# Patient Record
Sex: Male | Born: 1965
Health system: Southern US, Community
[De-identification: ages and names within clinical notes are randomized; demographics above are authoritative.]

## PROBLEM LIST (undated history)

## (undated) DIAGNOSIS — K219 Gastro-esophageal reflux disease without esophagitis: Secondary | ICD-10-CM

## (undated) DIAGNOSIS — I10 Essential (primary) hypertension: Secondary | ICD-10-CM

## (undated) DIAGNOSIS — G4730 Sleep apnea, unspecified: Secondary | ICD-10-CM

## (undated) HISTORY — PX: BOWEL RESECTION: SHX1257

## (undated) HISTORY — PX: RHINOPLASTY: SUR1284

---

## 1998-02-01 ENCOUNTER — Encounter: Payer: Self-pay | Admitting: Surgery

## 1998-02-01 ENCOUNTER — Ambulatory Visit (HOSPITAL_COMMUNITY): Admission: RE | Admit: 1998-02-01 | Discharge: 1998-02-01 | Payer: Self-pay | Admitting: Surgery

## 1998-04-24 ENCOUNTER — Ambulatory Visit (HOSPITAL_COMMUNITY): Admission: RE | Admit: 1998-04-24 | Discharge: 1998-04-24 | Payer: Self-pay | Admitting: Surgery

## 1998-06-13 ENCOUNTER — Inpatient Hospital Stay (HOSPITAL_COMMUNITY): Admission: RE | Admit: 1998-06-13 | Discharge: 1998-06-19 | Payer: Self-pay | Admitting: Surgery

## 1998-06-13 ENCOUNTER — Encounter: Payer: Self-pay | Admitting: Surgery

## 1999-05-29 ENCOUNTER — Ambulatory Visit (HOSPITAL_COMMUNITY): Admission: RE | Admit: 1999-05-29 | Discharge: 1999-05-29 | Payer: Self-pay | Admitting: Occupational Medicine

## 1999-05-29 ENCOUNTER — Encounter: Payer: Self-pay | Admitting: Occupational Medicine

## 1999-09-20 ENCOUNTER — Encounter: Payer: Self-pay | Admitting: Neurosurgery

## 1999-09-20 ENCOUNTER — Ambulatory Visit (HOSPITAL_COMMUNITY): Admission: RE | Admit: 1999-09-20 | Discharge: 1999-09-20 | Payer: Self-pay | Admitting: Neurosurgery

## 1999-10-05 ENCOUNTER — Ambulatory Visit (HOSPITAL_COMMUNITY): Admission: RE | Admit: 1999-10-05 | Discharge: 1999-10-05 | Payer: Self-pay | Admitting: Neurosurgery

## 1999-10-05 ENCOUNTER — Encounter: Payer: Self-pay | Admitting: Neurosurgery

## 1999-10-31 ENCOUNTER — Ambulatory Visit (HOSPITAL_COMMUNITY): Admission: RE | Admit: 1999-10-31 | Discharge: 1999-10-31 | Payer: Self-pay | Admitting: Neurosurgery

## 1999-12-06 ENCOUNTER — Encounter: Payer: Self-pay | Admitting: Neurosurgery

## 1999-12-06 ENCOUNTER — Ambulatory Visit (HOSPITAL_COMMUNITY): Admission: RE | Admit: 1999-12-06 | Discharge: 1999-12-06 | Payer: Self-pay | Admitting: Neurosurgery

## 2003-03-17 ENCOUNTER — Ambulatory Visit: Admission: RE | Admit: 2003-03-17 | Discharge: 2003-03-17 | Payer: Self-pay | Admitting: Emergency Medicine

## 2003-03-18 ENCOUNTER — Ambulatory Visit (HOSPITAL_COMMUNITY): Admission: RE | Admit: 2003-03-18 | Discharge: 2003-03-18 | Payer: Self-pay | Admitting: Neurology

## 2005-06-05 ENCOUNTER — Ambulatory Visit (HOSPITAL_BASED_OUTPATIENT_CLINIC_OR_DEPARTMENT_OTHER): Admission: RE | Admit: 2005-06-05 | Discharge: 2005-06-05 | Payer: Self-pay | Admitting: Otolaryngology

## 2005-06-09 ENCOUNTER — Ambulatory Visit: Payer: Self-pay | Admitting: Internal Medicine

## 2006-05-20 ENCOUNTER — Ambulatory Visit (HOSPITAL_COMMUNITY): Admission: RE | Admit: 2006-05-20 | Discharge: 2006-05-20 | Payer: Self-pay | Admitting: Orthopedic Surgery

## 2007-07-09 ENCOUNTER — Emergency Department (HOSPITAL_COMMUNITY): Admission: EM | Admit: 2007-07-09 | Discharge: 2007-07-09 | Payer: Self-pay | Admitting: Emergency Medicine

## 2008-04-29 ENCOUNTER — Emergency Department (HOSPITAL_COMMUNITY): Admission: EM | Admit: 2008-04-29 | Discharge: 2008-04-29 | Payer: Self-pay | Admitting: Family Medicine

## 2008-11-23 ENCOUNTER — Ambulatory Visit (HOSPITAL_COMMUNITY): Admission: RE | Admit: 2008-11-23 | Discharge: 2008-11-23 | Payer: Self-pay | Admitting: Emergency Medicine

## 2008-11-30 ENCOUNTER — Ambulatory Visit (HOSPITAL_COMMUNITY): Admission: RE | Admit: 2008-11-30 | Discharge: 2008-11-30 | Payer: Self-pay | Admitting: Internal Medicine

## 2009-05-29 ENCOUNTER — Ambulatory Visit (HOSPITAL_COMMUNITY): Admission: RE | Admit: 2009-05-29 | Discharge: 2009-05-29 | Payer: Self-pay | Admitting: Otolaryngology

## 2009-06-02 ENCOUNTER — Emergency Department (HOSPITAL_COMMUNITY)
Admission: EM | Admit: 2009-06-02 | Discharge: 2009-06-02 | Payer: Self-pay | Source: Home / Self Care | Admitting: Emergency Medicine

## 2009-06-08 ENCOUNTER — Encounter (INDEPENDENT_AMBULATORY_CARE_PROVIDER_SITE_OTHER): Payer: Self-pay | Admitting: *Deleted

## 2009-06-26 ENCOUNTER — Encounter: Payer: Self-pay | Admitting: Internal Medicine

## 2009-06-27 ENCOUNTER — Encounter: Payer: Self-pay | Admitting: Internal Medicine

## 2010-01-12 ENCOUNTER — Ambulatory Visit (HOSPITAL_COMMUNITY)
Admission: AD | Admit: 2010-01-12 | Discharge: 2010-01-12 | Payer: Self-pay | Source: Home / Self Care | Attending: Otolaryngology | Admitting: Otolaryngology

## 2010-01-12 ENCOUNTER — Ambulatory Visit (HOSPITAL_COMMUNITY)
Admission: RE | Admit: 2010-01-12 | Discharge: 2010-01-12 | Payer: Self-pay | Source: Home / Self Care | Attending: Otolaryngology | Admitting: Otolaryngology

## 2010-01-24 ENCOUNTER — Ambulatory Visit (HOSPITAL_COMMUNITY)
Admission: RE | Admit: 2010-01-24 | Discharge: 2010-01-25 | Payer: Self-pay | Source: Home / Self Care | Attending: Otolaryngology | Admitting: Otolaryngology

## 2010-04-09 LAB — BASIC METABOLIC PANEL
BUN: 5 mg/dL — ABNORMAL LOW (ref 6–23)
CO2: 27 mEq/L (ref 19–32)
Calcium: 8.9 mg/dL (ref 8.4–10.5)
Glucose, Bld: 167 mg/dL — ABNORMAL HIGH (ref 70–99)
Sodium: 139 mEq/L (ref 135–145)

## 2010-04-09 LAB — SURGICAL PCR SCREEN: Staphylococcus aureus: POSITIVE — AB

## 2010-04-09 LAB — CBC
Hemoglobin: 14.6 g/dL (ref 13.0–17.0)
MCH: 31.2 pg (ref 26.0–34.0)
MCHC: 34.1 g/dL (ref 30.0–36.0)

## 2010-04-10 NOTE — Letter (Signed)
Summary: [facility]   Imported By: Marylou Mccoy 04/06/2010 14:31:12  _____________________________________________________________________  External Attachment:    Type:   Image     Comment:   External Document

## 2010-04-17 LAB — CBC
HCT: 41.9 % (ref 39.0–52.0)
Hemoglobin: 14.6 g/dL (ref 13.0–17.0)
MCHC: 34.9 g/dL (ref 30.0–36.0)
RDW: 13.5 % (ref 11.5–15.5)

## 2010-04-17 NOTE — Letter (Signed)
Summary: [facility]   Imported By: Marylou Mccoy 04/06/2010 14:35:29  _____________________________________________________________________  External Attachment:    Type:   Image     Comment:   External Document

## 2010-06-15 NOTE — Procedures (Signed)
NAME:  Gary Ramos, Gary Ramos                ACCOUNT NO.:  [id number]   MEDICAL RECORD NO.:  [id number]          PATIENT TYPE:  OUT   LOCATION:  SLEEP CENTER                 FACILITY:  [facility]   PHYSICIAN:  Clinton D. Maple Hudson, M.D. DATE OF BIRTH:  [birth year]   DATE OF STUDY:  06/05/2005                              NOCTURNAL POLYSOMNOGRAM   INDICATION FOR STUDY:  Hypersomnia with sleep apnea.   Epworth sleepiness score 06/24, BMI 33.  Weight 225 pounds.  No home  medications listed.  This study was done as a daytime study to accommodate  his work schedule and usual sleep habit.   SLEEP ARCHITECTURE:  Total sleep time 313 minutes with sleep efficiency 88%.  Stage I was 6%, stage II 53%, stages III and IV 28%.  REM 13% of total sleep  time.  Sleep latency 7 minutes, REM latency 164 minutes, awake after sleep  onset 29 minutes, arousal index 22.6, which was mildly increased.  No  bedtime medication was reported.   RESPIRATORY DATA:  Split study protocol.  Apnea/hypopnea index (AHI, RDI)  105.7 obstructive events per hour indicating severe obstructive sleep  apnea/hypopnea syndrome before CPAP.  This included 121 obstructive apneas,  108 hypopneas.  Events were more common while supine but also significantly  present while sleeping on sides.  REM AHI 1.5 per hour.  CPAP was titrated  to 9 cwp, AHI 1.1 per hour.  The technician did not indicate which CPAP mask  style was used, but did report that the patient found it uncomfortable  despite adjustments.   OXYGEN DATA:  Very loud snoring with oxygen desaturation to a nadir of 81%.  Mean oxygen saturation after CPAP control was 95-98% on room air.   CARDIAC DATA:  Normal sinus rhythm.   MOVEMENT/PARASOMNIA:  Occasional leg jerk, insignificant.  Bathroom x2.   IMPRESSION/RECOMMENDATION:  1.  Daytime sleep study recording sleep time onset 8:45 a.m. to 2:43 p.m.,      to accommodate the patient's work schedule and sleep habit.  Expect some      loss  of sleep quality associated with third shift and shifting sleep      schedules.  2.  Severe obstructive sleep apnea/hypopnea syndrome, AHI 105.7 per hour.      Events were relatively more common while supine but not substantially      improved by sleeping on side.  Very loud snoring with oxygen      desaturation to a nadir of 81%.  3.  Successful CPAP titration to 9 cwp, AHI of 1.1 per hour.  The patient      was not comfortable with the mask styles tried and would need additional      fitting by [facility].  A heated humidifier was used.      Clinton D. Maple Hudson, M.D.  Diplomate, Biomedical engineer of Sleep Medicine  Electronically Signed     CDY/MEDQ  D:  06/09/2005 11:46:03  T:  06/10/2005 11:05:27  Job:  [id number]

## 2013-04-27 ENCOUNTER — Ambulatory Visit (HOSPITAL_COMMUNITY)
Admission: RE | Admit: 2013-04-27 | Discharge: 2013-04-27 | Disposition: A | Discharge status: Home / Self Care | Payer: 59 | Source: Ambulatory Visit | Attending: Internal Medicine | Admitting: Internal Medicine

## 2013-04-27 ENCOUNTER — Other Ambulatory Visit (HOSPITAL_COMMUNITY): Payer: Self-pay | Admitting: Internal Medicine

## 2013-04-27 DIAGNOSIS — R29898 Other symptoms and signs involving the musculoskeletal system: Secondary | ICD-10-CM

## 2013-04-27 DIAGNOSIS — R52 Pain, unspecified: Secondary | ICD-10-CM

## 2013-04-27 DIAGNOSIS — M79609 Pain in unspecified limb: Secondary | ICD-10-CM

## 2013-04-27 DIAGNOSIS — M4802 Spinal stenosis, cervical region: Secondary | ICD-10-CM | POA: Insufficient documentation

## 2013-04-27 DIAGNOSIS — M542 Cervicalgia: Secondary | ICD-10-CM | POA: Insufficient documentation

## 2013-04-27 DIAGNOSIS — M5020 Other cervical disc displacement, unspecified cervical region: Secondary | ICD-10-CM | POA: Insufficient documentation

## 2013-04-27 DIAGNOSIS — R209 Unspecified disturbances of skin sensation: Secondary | ICD-10-CM | POA: Insufficient documentation

## 2013-05-07 ENCOUNTER — Other Ambulatory Visit: Payer: Self-pay | Admitting: Neurosurgery

## 2013-05-31 ENCOUNTER — Encounter (HOSPITAL_COMMUNITY): Payer: Self-pay | Admitting: Pharmacy Technician

## 2013-06-01 ENCOUNTER — Encounter (HOSPITAL_COMMUNITY): Payer: Self-pay

## 2013-06-01 ENCOUNTER — Encounter (HOSPITAL_COMMUNITY)
Admission: RE | Admit: 2013-06-01 | Discharge: 2013-06-01 | Disposition: A | Discharge status: Home / Self Care | Payer: 59 | Source: Ambulatory Visit | Attending: Neurosurgery | Admitting: Neurosurgery

## 2013-06-01 ENCOUNTER — Encounter (HOSPITAL_COMMUNITY)
Admission: RE | Admit: 2013-06-01 | Discharge: 2013-06-01 | Disposition: A | Discharge status: Home / Self Care | Payer: 59 | Source: Ambulatory Visit | Attending: Anesthesiology | Admitting: Anesthesiology

## 2013-06-01 DIAGNOSIS — Z01812 Encounter for preprocedural laboratory examination: Secondary | ICD-10-CM | POA: Insufficient documentation

## 2013-06-01 DIAGNOSIS — Z01810 Encounter for preprocedural cardiovascular examination: Secondary | ICD-10-CM | POA: Insufficient documentation

## 2013-06-01 DIAGNOSIS — Z01818 Encounter for other preprocedural examination: Secondary | ICD-10-CM | POA: Insufficient documentation

## 2013-06-01 HISTORY — DX: Gastro-esophageal reflux disease without esophagitis: K21.9

## 2013-06-01 HISTORY — DX: Essential (primary) hypertension: I10

## 2013-06-01 HISTORY — DX: Sleep apnea, unspecified: G47.30

## 2013-06-01 LAB — BASIC METABOLIC PANEL
BUN: 14 mg/dL (ref 6–23)
CO2: 27 mEq/L (ref 19–32)
Calcium: 9.3 mg/dL (ref 8.4–10.5)
Chloride: 103 mEq/L (ref 96–112)
Creatinine, Ser: 0.83 mg/dL (ref 0.50–1.35)
Glucose, Bld: 93 mg/dL (ref 70–99)
POTASSIUM: 4.5 meq/L (ref 3.7–5.3)
Sodium: 141 mEq/L (ref 137–147)

## 2013-06-01 LAB — SURGICAL PCR SCREEN
MRSA, PCR: NEGATIVE
Staphylococcus aureus: POSITIVE — AB

## 2013-06-01 LAB — CBC
HCT: 44.5 % (ref 39.0–52.0)
HEMOGLOBIN: 15.5 g/dL (ref 13.0–17.0)
MCH: 32.6 pg (ref 26.0–34.0)
MCHC: 34.8 g/dL (ref 30.0–36.0)
MCV: 93.5 fL (ref 78.0–100.0)
Platelets: 250 10*3/uL (ref 150–400)
RBC: 4.76 MIL/uL (ref 4.22–5.81)
RDW: 13.3 % (ref 11.5–15.5)
WBC: 9.7 10*3/uL (ref 4.0–10.5)

## 2013-06-01 NOTE — Progress Notes (Signed)
Prescription called into [facility] out[facility] pharmacy per patient request.

## 2013-06-01 NOTE — Pre-Procedure Instructions (Signed)
Duncan DullJoseph A Halleck  06/01/2013   Your procedure is scheduled on:  Friday, May 15th  [facility] Admitting at 0530 AM.  Call this number if you have problems the morning of surgery: [phone]   Remember:   Do not eat food or drink liquids after midnight.   Take these medicines the morning of surgery with A SIP OF WATER: prevacid   Do not wear jewelry.  Do not wear lotions, powders, or perfumes. You may wear deodorant.  Do not shave 48 hours prior to surgery. Men may shave face and neck.  Do not bring valuables to the hospital.  [facility] is not responsible for any belongings or valuables.               Contacts, dentures or bridgework may not be worn into surgery.  Leave suitcase in the car. After surgery it may be brought to your room.  For patients admitted to the hospital, discharge time is determined by your  treatment team.               Patients discharged the day of surgery will not be allowed to drive home.  Please read over the following fact sheets that you were given: Pain Booklet, Coughing and Deep Breathing, MRSA Information and Surgical Site Infection Prevention [place] - Preparing for Surgery  Before surgery, you can play an important role.  Because skin is not sterile, your skin needs to be as free of germs as possible.  You can reduce the number of germs on you skin by washing with CHG (chlorahexidine gluconate) soap before surgery.  CHG is an antiseptic cleaner which kills germs and bonds with the skin to continue killing germs even after washing.  Please DO NOT use if you have an allergy to CHG or antibacterial soaps.  If your skin becomes reddened/irritated stop using the CHG and inform your nurse when you arrive at Short Stay.  Do not shave (including legs and underarms) for at least 48 hours prior to the first CHG shower.  You may shave your face.  Please follow these instructions carefully:   1.  Shower with CHG Soap the night before  surgery and the morning of Surgery.  2.  If you choose to wash your hair, wash your hair first as usual with your normal shampoo.  3.  After you shampoo, rinse your hair and body thoroughly to remove the shampoo.  4.  Use CHG as you would any other liquid soap.  You can apply CHG directly to the skin and wash gently with scrungie or a clean washcloth.  5.  Apply the CHG Soap to your body ONLY FROM THE NECK DOWN.  Do not use on open wounds or open sores.  Avoid contact with your eyes, ears, mouth and genitals (private parts).  Wash genitals (private parts) with your normal soap.  6.  Wash thoroughly, paying special attention to the area where your surgery will be performed.  7.  Thoroughly rinse your body with warm water from the neck down.  8.  DO NOT shower/wash with your normal soap after using and rinsing off the CHG Soap.  9.  Pat yourself dry with a clean towel.            10.  Wear clean pajamas.            11.  Place clean sheets on your bed the night of your first shower and do not  sleep with pets.  Day of Surgery  Do not apply any lotions/deoderants the morning of surgery.  Please wear clean clothes to the hospital/surgery center.

## 2013-06-01 NOTE — Progress Notes (Signed)
Primary physician - dr. Pearson GrippeJames kim No prior cardiac testing other than ekg several years ago.

## 2013-06-09 NOTE — H&P (Signed)
> [address] [place], [place] [address] Phone: [phone]   Patient ID:   [id number] Patient: Gary Ramos  Date of Birth: 11/20/1965 Visit Type: Office Visit   Date: 05/05/2013 01:00 PM Provider: Danae Orleans. Venetia Maxon MD   This 48 year old male presents for back pain.  History of Present Illness: 1.  back pain  Gary Ramos, 48y.o. 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  S/S began after cold s/s and coughing in Feb.  DepoMedrol IM helped s/s, but percocet did not offer relief.    Motrin 800mg  TID  SxHx: '98 CTR; '00 bowel resection; '13 septoplasty  MRI, X-ray on [facility]  Patient is currently complaining of left shoulder and upper back pain along with scapula.  He notes numbness in his left hand and arm.  He says he is weak.  He has had significantly increasing pain since February of this year when he developed the symptoms after a cold with coughing.  He notes that he got some relief with a Depo-Medrol IM injection of 80 mg.  However this is no longer helpful.        PAST MEDICAL/SURGICAL HISTORY   (Detailed)  Disease/disorder Onset Date Management Date Comments    Carpal tunnel release      Small bowel resection      Rhinoplasty/Septoplasty    Hypertension          PAST MEDICAL HISTORY, SURGICAL HISTORY, FAMILY HISTORY, SOCIAL HISTORY AND REVIEW OF SYSTEMS I have reviewed the patient's past medical, surgical, family and social history as well as the comprehensive review of systems as included on the [facility] history form dated 05/05/2013, which I have signed.  Family History  (Detailed)  Relationship Family Member Name Deceased Age at Death Condition Onset Age Cause of Death  Mother    Hypertension  N  Mother    Diabetes mellitus type 2  N   SOCIAL HISTORY  (Detailed) Tobacco use reviewed. Preferred language  is Unknown.   Smoking status: Never smoker.  SMOKING STATUS Use Status Type Smoking Status Usage Per Day Years Used Total Pack Years  no/never  Never smoker             MEDICATIONS(added, continued or stopped this visit):   Started Medication Directions Instruction Stopped   lisinopril 10 mg tablet take 1 tablet by oral route  every day     motrin 800 mg ORAL TABLET 1 by mouth every 8 hours as needed     prednisone 20 mg tablet take 1 tablet by oral route  every day  05/05/2013   Prevacid 30 mg capsule,delayed release take 1 capsule by oral route 2 times every day before a meal    05/05/2013 tramadol 50 mg tablet take 1 tablet by oral route  every 6 hours as needed      ALLERGIES:  Ingredient Reaction Medication Name Comment  NO KNOWN ALLERGIES     No known allergies.  REVIEW OF SYSTEMS System Neg/Pos Details  Constitutional Negative Chills, fatigue, fever, malaise, night sweats, weight gain and weight loss.  ENMT Negative Ear drainage, hearing loss, nasal drainage, otalgia, sinus pressure and sore throat.  Eyes Negative Eye discharge, eye pain and vision changes.  Respiratory Negative Chronic cough, cough, dyspnea, known TB exposure and wheezing.  Cardio Negative Chest pain, claudication, edema and irregular heartbeat/palpitations.  GI Negative Abdominal  pain, blood in stool, change in stool pattern, constipation, decreased appetite, diarrhea, heartburn, nausea and vomiting.  GU Negative Dribbling, dysuria, erectile dysfunction, hematuria, polyuria, slow stream, urinary frequency, urinary incontinence and urinary retention.  Endocrine Negative Cold intolerance, heat intolerance, polydipsia and polyphagia.  Neuro Positive Numbness in extremity.  Psych Negative Anxiety, depression and insomnia.  Integumentary Negative Brittle hair, brittle nails, change in shape/size of mole(s), hair loss, hirsutism, hives, pruritus, rash and skin lesion.  MS Positive Left Scapular, LUE  pain.  Hema/Lymph Negative Easy bleeding, easy bruising and lymphadenopathy.  Allergic/Immuno Negative Contact allergy, environmental allergies, food allergies and seasonal allergies.  Reproductive Negative Penile discharge and sexual dysfunction.    Vitals Date Temp F BP Pulse Ht In Wt Lb BMI BSA Pain Score  05/05/2013  115/73 71 68 229 34.82  5/10     PHYSICAL EXAM General Level of Distress: no acute distress Overall Appearance: normal  Head and Face  Right Left  Fundoscopic Exam:  normal normal    Cardiovascular Cardiac: regular rate and rhythm without murmur  Right Left  Carotid Pulses: normal normal  Respiratory Lungs: clear to auscultation  Neurological Orientation: normal Recent and Remote Memory: normal Attention Span and Concentration:   normal Language: normal Fund of Knowledge: normal  Right Left Sensation: normal normal Upper Extremity Coordination: normal normal  Lower Extremity Coordination: normal normal  Musculoskeletal Gait and Station: normal  Right Left Upper Extremity Muscle Strength: normal normal Lower Extremity Muscle Strength: normal normal Upper Extremity Muscle Tone:  normal normal Lower Extremity Muscle Tone: normal normal  Motor Strength Upper and lower extremity motor strength was tested in the clinically pertinent muscles. Any abnormal findings will be noted below.   Right Left Triceps:  4/5   Deep Tendon Reflexes  Right Left Biceps: normal normal Triceps: normal absent Brachiloradialis: normal normal Patellar: normal normal Achilles: normal normal  Sensory Sensation was tested at C2 to T1. Any abnormal findings will be noted below.  Right Left C6:  decreased C7:  decreased  Cranial Nerves II. Optic Nerve/Visual Fields: normal III. Oculomotor: normal IV. Trochlear: normal V. Trigeminal: normal VI. Abducens: normal VII. Facial: normal VIII. Acoustic/Vestibular: normal IX. Glossopharyngeal: normal X.  Vagus: normal XI. Spinal Accessory: normal XII. Hypoglossal: normal  Motor and other Tests Lhermittes: negative Rhomberg: negative Pronator drift: absent     Right Left Spurlings negative positive Hoffman's: normal normal Clonus: normal normal Babinski: normal normal SLR: negative negative Patrick's Pearlean Brownie(Faber): negative negative Toe Walk: normal normal Toe Lift: normal normal Heel Walk: normal normal Tinels Elbow: negative negative Tinels Wrist: negative negative Phalen: negative negative      DIAGNOSTIC RESULTS Diagnostic report text  CLINICAL DATA: Neck pain with numbness extending into left arm. Tingling in the first and second digits.  EXAM: MRI CERVICAL SPINE WITHOUT CONTRAST  TECHNIQUE: Multiplanar, multisequence MR imaging was performed. No intravenous contrast was administered.  COMPARISON: DG CERVICAL SPINE COMPLETE 4+V dated 04/26/2013  FINDINGS: Vertebral alignment is normal. Vertebral body heights are preserved. Vertebral bone marrow signal is within normal limits. The craniocervical junction is unremarkable. The cervical spinal cord is normal in caliber and signal. Visualized paraspinal soft tissues are unremarkable.  C2-3: Left-sided uncovertebral hypertrophy results in at most minimal left neural foraminal narrowing. No spinal canal stenosis.  C3-4: Mild uncovertebral hypertrophy results in minimal left neural foraminal narrowing. No spinal canal stenosis.  C4-5: Mild uncovertebral hypertrophy results in minimal right neural foraminal narrowing. No spinal canal stenosis.  C5-6: Broad-based posterior disc osteophyte  complex results in mild spinal stenosis and mild bilateral neural foraminal stenosis.  C6-7: Moderately large disc extrusion left of midline results in moderate spinal stenosis and at least moderate proximal left neural foraminal stenosis. Mild right uncovertebral hypertrophy results in mild right neural foraminal narrowing. There  is slight flattening of the ventral spinal cord.  C7-T1: Negative.  IMPRESSION: 1. C6-7 disc extrusion resulting in moderate spinal stenosis and a left neural foraminal stenosis. 2. Mild spinal stenosis and bilateral neural foraminal stenosis at C5-6 due to a disc osteophyte complex.   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  He also has spondylosis to a milder degree at the C5 C6 level.  I recommended that the patient undergo surgery for this significant disc herniation with weakness.  I have recommended that he undergo a disc arthroplasty at the C6 C7 level and I think that this makes sense in order to protect the C5 C6 level which has some early degeneration.  It may not be possible to perform an arthroplasty at the C6 C7 level because of the patient's large body habitus but I believe that this would be the best option for him from a surgical standpoint.  I do not believe that nonsurgical treatment would be appropriate.  The patient wishes to proceed with surgery and we have scheduled this for 15 May at [facility].  He was fitted for a soft cervical collar.  Risks and benefits were discussed in detail with the patient and he wishes to proceed.  Completed Orders (this encounter) Order Details Reason Side Interpretation Result Initial Treatment Date Region  Lifestyle education regarding diet Follow up with Primary Care Doctor         Assessment/Plan # Detail Type Description   1. Assessment BMI 34.0-34.9,ADULT (V85.34).   Plan Orders Today's instructions / counseling include(s) Lifestyle education regarding diet.       2. 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       3. 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       4. Assessment Neck pain (723.1).         Pain Assessment/Treatment Pain Scale: 5/10. Method: Numeric Pain Intensity Scale. Onset: 03/07/2013. Duration: varies. Quality: aching. Pain Assessment/Treatment follow-up plan of care: Patient taking pain medication.  Disc C cervical disc arthroplasty C6 C7 level.  This is not covered by [employer] and Gary Ramos will appeal this with the hospital prior to proceeding with surgery.  Orders: Diagnostic Procedures: Assessment Procedure  723.4 Artificial Disc Replacement-Cervical - C6-C7  Instruction(s)/Education: Assessment Instruction  V85.34 Lifestyle education regarding diet    MEDICATIONS PRESCRIBED TODAY    Rx Quantity Refills  TRAMADOL HCL 50 mg  90 0            Provider:  Danae Orleans. Venetia Maxon MD  05/09/2013 08:08 PM Dictation edited by: Danae Orleans. Venetia Maxon    CC Providers: Pearson Grippe [address] [place], [place] [address]-  ----------------------------------------------------------------------------------------------------------------------------------------------------------------------         Electronically signed by Danae Orleans Venetia Maxon MD on 05/09/2013 08:08 PM    This letter is to clarify my recommendations for surgery for Gary Ramos.  I had previously recommended a cervical disc arthroplasty at the C6  C7 level for a large disc herniation at C6 C7 on the left with a left C7 radiculopathy.  The patient has milder degenerative changes at the C5 C6 level and we were hoping to avoid surgery at that level before potentiation of future deterioration at that level as well.  His insurance company has rejected the request for a cervical disc arthroplasty.  He was instructed that an appeal would take greater than 90 days.  The patient continues to have a significant symptomatic cervical radiculopathy and I have advised the patient to proceed with anterior cervical  decompression and fusion at the C6 C7 level rather than waiting for a lengthy appeals process and risking permanent neurologic deficit in the process of awaiting an appeal.  I do not believe that there is a useful nonsurgical option for the patient.  He is at risk for degenerative changes at the C5 C6 level in the future but I do not believe he should have surgery at that level at this time.

## 2013-06-10 MED ORDER — CEFAZOLIN SODIUM-DEXTROSE 2-3 GM-% IV SOLR
2.0000 g | INTRAVENOUS | Status: AC
  Administered 2013-06-11: 2 g via INTRAVENOUS
  Filled 2013-06-10: qty 50

## 2013-06-11 ENCOUNTER — Encounter (HOSPITAL_COMMUNITY): Payer: 59 | Admitting: Anesthesiology

## 2013-06-11 ENCOUNTER — Ambulatory Visit (HOSPITAL_COMMUNITY): Payer: 59 | Admitting: Anesthesiology

## 2013-06-11 ENCOUNTER — Ambulatory Visit (HOSPITAL_COMMUNITY)
Admission: RE | Admit: 2013-06-11 | Discharge: 2013-06-11 | Disposition: A | Discharge status: Home / Self Care | Payer: 59 | Source: Ambulatory Visit | Attending: Neurosurgery | Admitting: Neurosurgery

## 2013-06-11 ENCOUNTER — Ambulatory Visit (HOSPITAL_COMMUNITY): Payer: 59

## 2013-06-11 ENCOUNTER — Encounter (HOSPITAL_COMMUNITY): Payer: Self-pay | Admitting: Surgery

## 2013-06-11 ENCOUNTER — Encounter (HOSPITAL_COMMUNITY)
Admission: RE | Disposition: A | Discharge status: Home / Self Care | Payer: Self-pay | Source: Ambulatory Visit | Attending: Neurosurgery

## 2013-06-11 DIAGNOSIS — M47812 Spondylosis without myelopathy or radiculopathy, cervical region: Secondary | ICD-10-CM | POA: Insufficient documentation

## 2013-06-11 DIAGNOSIS — Z9049 Acquired absence of other specified parts of digestive tract: Secondary | ICD-10-CM | POA: Insufficient documentation

## 2013-06-11 DIAGNOSIS — I10 Essential (primary) hypertension: Secondary | ICD-10-CM | POA: Insufficient documentation

## 2013-06-11 DIAGNOSIS — M5020 Other cervical disc displacement, unspecified cervical region: Secondary | ICD-10-CM | POA: Diagnosis present

## 2013-06-11 HISTORY — PX: ANTERIOR CERVICAL DECOMP/DISCECTOMY FUSION: SHX1161

## 2013-06-11 SURGERY — ANTERIOR CERVICAL DECOMPRESSION/DISCECTOMY FUSION 1 LEVEL
Anesthesia: General

## 2013-06-11 MED ORDER — METHOCARBAMOL 1000 MG/10ML IJ SOLN
500.0000 mg | Freq: Four times a day (QID) | INTRAVENOUS | Status: DC | PRN
  Filled 2013-06-11: qty 5

## 2013-06-11 MED ORDER — DEXAMETHASONE SODIUM PHOSPHATE 10 MG/ML IJ SOLN
INTRAMUSCULAR | Status: DC | PRN
  Administered 2013-06-11: 10 mg via INTRAVENOUS

## 2013-06-11 MED ORDER — NEOSTIGMINE METHYLSULFATE 10 MG/10ML IV SOLN
INTRAVENOUS | Status: AC
  Filled 2013-06-11: qty 1

## 2013-06-11 MED ORDER — HYDROMORPHONE HCL PF 1 MG/ML IJ SOLN
INTRAMUSCULAR | Status: AC
  Administered 2013-06-11: 0.5 mg via INTRAVENOUS
  Filled 2013-06-11: qty 1

## 2013-06-11 MED ORDER — DOCUSATE SODIUM 100 MG PO CAPS
100.0000 mg | ORAL_CAPSULE | Freq: Two times a day (BID) | ORAL | Status: DC
  Filled 2013-06-11 (×2): qty 1

## 2013-06-11 MED ORDER — 0.9 % SODIUM CHLORIDE (POUR BTL) OPTIME
TOPICAL | Status: DC | PRN
  Administered 2013-06-11: 1000 mL

## 2013-06-11 MED ORDER — DEXAMETHASONE SODIUM PHOSPHATE 10 MG/ML IJ SOLN
INTRAMUSCULAR | Status: AC
  Filled 2013-06-11: qty 1

## 2013-06-11 MED ORDER — METHOCARBAMOL 500 MG PO TABS
ORAL_TABLET | ORAL | Status: AC
  Administered 2013-06-11: 500 mg via ORAL
  Filled 2013-06-11: qty 1

## 2013-06-11 MED ORDER — FENTANYL CITRATE 0.05 MG/ML IJ SOLN
INTRAMUSCULAR | Status: DC | PRN
  Administered 2013-06-11: 100 ug via INTRAVENOUS
  Administered 2013-06-11 (×3): 50 ug via INTRAVENOUS

## 2013-06-11 MED ORDER — ARTIFICIAL TEARS OP OINT
TOPICAL_OINTMENT | OPHTHALMIC | Status: AC
  Filled 2013-06-11: qty 3.5

## 2013-06-11 MED ORDER — SODIUM CHLORIDE 0.9 % IJ SOLN: 3.0000 mL | Freq: Two times a day (BID) | INTRAMUSCULAR | Status: DC

## 2013-06-11 MED ORDER — PANTOPRAZOLE SODIUM 40 MG PO TBEC: 40.0000 mg | DELAYED_RELEASE_TABLET | Freq: Every day | ORAL | Status: DC

## 2013-06-11 MED ORDER — SODIUM CHLORIDE 0.9 % IV SOLN: 250.0000 mL | INTRAVENOUS | Status: DC

## 2013-06-11 MED ORDER — HYDROMORPHONE HCL PF 1 MG/ML IJ SOLN
0.2500 mg | INTRAMUSCULAR | Status: DC | PRN
  Administered 2013-06-11 (×2): 0.5 mg via INTRAVENOUS

## 2013-06-11 MED ORDER — THROMBIN 5000 UNITS EX SOLR
CUTANEOUS | Status: DC | PRN
  Administered 2013-06-11 (×2): 5000 [IU] via TOPICAL

## 2013-06-11 MED ORDER — PHENYLEPHRINE 40 MCG/ML (10ML) SYRINGE FOR IV PUSH (FOR BLOOD PRESSURE SUPPORT)
PREFILLED_SYRINGE | INTRAVENOUS | Status: AC
  Filled 2013-06-11: qty 10

## 2013-06-11 MED ORDER — HEMOSTATIC AGENTS (NO CHARGE) OPTIME
TOPICAL | Status: DC | PRN
  Administered 2013-06-11: 1 via TOPICAL

## 2013-06-11 MED ORDER — MENTHOL 3 MG MT LOZG: 1.0000 | LOZENGE | OROMUCOSAL | Status: DC | PRN

## 2013-06-11 MED ORDER — PROPOFOL 10 MG/ML IV BOLUS
INTRAVENOUS | Status: AC
  Filled 2013-06-11: qty 20

## 2013-06-11 MED ORDER — OXYCODONE HCL 5 MG/5ML PO SOLN: 5.0000 mg | Freq: Once | ORAL | Status: AC | PRN

## 2013-06-11 MED ORDER — MIDAZOLAM HCL 5 MG/5ML IJ SOLN
INTRAMUSCULAR | Status: DC | PRN
  Administered 2013-06-11: 2 mg via INTRAVENOUS

## 2013-06-11 MED ORDER — LIDOCAINE HCL (CARDIAC) 20 MG/ML IV SOLN
INTRAVENOUS | Status: AC
  Filled 2013-06-11: qty 5

## 2013-06-11 MED ORDER — OXYCODONE HCL 5 MG PO TABS
5.0000 mg | ORAL_TABLET | Freq: Once | ORAL | Status: AC | PRN
  Administered 2013-06-11: 5 mg via ORAL

## 2013-06-11 MED ORDER — ALUM & MAG HYDROXIDE-SIMETH 200-200-20 MG/5ML PO SUSP: 30.0000 mL | Freq: Four times a day (QID) | ORAL | Status: DC | PRN

## 2013-06-11 MED ORDER — ONDANSETRON HCL 4 MG/2ML IJ SOLN
INTRAMUSCULAR | Status: AC
  Filled 2013-06-11: qty 2

## 2013-06-11 MED ORDER — GELATIN ABSORBABLE MT POWD
OROMUCOSAL | Status: DC | PRN
  Administered 2013-06-11: 09:00:00 via TOPICAL

## 2013-06-11 MED ORDER — HYDROCODONE-ACETAMINOPHEN 5-325 MG PO TABS: 1.0000 | ORAL_TABLET | ORAL | Status: DC | PRN

## 2013-06-11 MED ORDER — HYDROCODONE-ACETAMINOPHEN 5-325 MG PO TABS
1.0000 | ORAL_TABLET | ORAL | Status: DC | PRN
  Administered 2013-06-11: 2 via ORAL
  Filled 2013-06-11: qty 2

## 2013-06-11 MED ORDER — OXYCODONE HCL 5 MG PO TABS
ORAL_TABLET | ORAL | Status: AC
  Administered 2013-06-11: 5 mg via ORAL
  Filled 2013-06-11: qty 1

## 2013-06-11 MED ORDER — LISINOPRIL 10 MG PO TABS: 10.0000 mg | ORAL_TABLET | Freq: Every day | ORAL | Status: DC

## 2013-06-11 MED ORDER — LACTATED RINGERS IV SOLN
INTRAVENOUS | Status: DC | PRN
  Administered 2013-06-11 (×3): via INTRAVENOUS

## 2013-06-11 MED ORDER — METHOCARBAMOL 500 MG PO TABS: 500.0000 mg | ORAL_TABLET | Freq: Four times a day (QID) | ORAL | Status: DC | PRN

## 2013-06-11 MED ORDER — ONDANSETRON HCL 4 MG/2ML IJ SOLN: 4.0000 mg | INTRAMUSCULAR | Status: DC | PRN

## 2013-06-11 MED ORDER — ONDANSETRON HCL 4 MG/2ML IJ SOLN
INTRAMUSCULAR | Status: DC | PRN
  Administered 2013-06-11: 4 mg via INTRAVENOUS

## 2013-06-11 MED ORDER — SODIUM CHLORIDE 0.9 % IJ SOLN: 3.0000 mL | INTRAMUSCULAR | Status: DC | PRN

## 2013-06-11 MED ORDER — NEOSTIGMINE METHYLSULFATE 10 MG/10ML IV SOLN
INTRAVENOUS | Status: DC | PRN
  Administered 2013-06-11: 4 mg via INTRAVENOUS

## 2013-06-11 MED ORDER — FENTANYL CITRATE 0.05 MG/ML IJ SOLN
INTRAMUSCULAR | Status: AC
  Filled 2013-06-11: qty 5

## 2013-06-11 MED ORDER — PHENYLEPHRINE HCL 10 MG/ML IJ SOLN
INTRAMUSCULAR | Status: DC | PRN
  Administered 2013-06-11 (×2): 40 ug via INTRAVENOUS
  Administered 2013-06-11: 80 ug via INTRAVENOUS

## 2013-06-11 MED ORDER — ARTIFICIAL TEARS OP OINT
TOPICAL_OINTMENT | OPHTHALMIC | Status: DC | PRN
  Administered 2013-06-11: 1 via OPHTHALMIC

## 2013-06-11 MED ORDER — LIDOCAINE-EPINEPHRINE 1 %-1:100000 IJ SOLN
INTRAMUSCULAR | Status: DC | PRN
  Administered 2013-06-11: 3 mL

## 2013-06-11 MED ORDER — ROCURONIUM BROMIDE 50 MG/5ML IV SOLN
INTRAVENOUS | Status: AC
  Filled 2013-06-11: qty 2

## 2013-06-11 MED ORDER — KCL IN DEXTROSE-NACL 20-5-0.45 MEQ/L-%-% IV SOLN
INTRAVENOUS | Status: DC
  Filled 2013-06-11 (×2): qty 1000

## 2013-06-11 MED ORDER — OXYCODONE-ACETAMINOPHEN 5-325 MG PO TABS: 1.0000 | ORAL_TABLET | ORAL | Status: DC | PRN

## 2013-06-11 MED ORDER — TRAMADOL HCL 50 MG PO TABS: 50.0000 mg | ORAL_TABLET | Freq: Three times a day (TID) | ORAL | Status: DC | PRN

## 2013-06-11 MED ORDER — MIDAZOLAM HCL 2 MG/2ML IJ SOLN
INTRAMUSCULAR | Status: AC
  Filled 2013-06-11: qty 2

## 2013-06-11 MED ORDER — EPHEDRINE SULFATE 50 MG/ML IJ SOLN
INTRAMUSCULAR | Status: DC | PRN
  Administered 2013-06-11 (×3): 10 mg via INTRAVENOUS

## 2013-06-11 MED ORDER — PROPOFOL 10 MG/ML IV BOLUS
INTRAVENOUS | Status: DC | PRN
  Administered 2013-06-11: 50 mg via INTRAVENOUS
  Administered 2013-06-11: 200 mg via INTRAVENOUS

## 2013-06-11 MED ORDER — IBUPROFEN 800 MG PO TABS
800.0000 mg | ORAL_TABLET | Freq: Four times a day (QID) | ORAL | Status: DC | PRN
  Filled 2013-06-11: qty 1

## 2013-06-11 MED ORDER — MORPHINE SULFATE 2 MG/ML IJ SOLN: 1.0000 mg | INTRAMUSCULAR | Status: DC | PRN

## 2013-06-11 MED ORDER — ACETAMINOPHEN 325 MG PO TABS: 650.0000 mg | ORAL_TABLET | ORAL | Status: DC | PRN

## 2013-06-11 MED ORDER — ROCURONIUM BROMIDE 100 MG/10ML IV SOLN
INTRAVENOUS | Status: DC | PRN
  Administered 2013-06-11: 50 mg via INTRAVENOUS
  Administered 2013-06-11: 10 mg via INTRAVENOUS

## 2013-06-11 MED ORDER — GLYCOPYRROLATE 0.2 MG/ML IJ SOLN
INTRAMUSCULAR | Status: DC | PRN
  Administered 2013-06-11: 0.6 mg via INTRAVENOUS

## 2013-06-11 MED ORDER — SENNA 8.6 MG PO TABS
1.0000 | ORAL_TABLET | Freq: Two times a day (BID) | ORAL | Status: DC
  Administered 2013-06-11: 8.6 mg via ORAL
  Filled 2013-06-11: qty 1

## 2013-06-11 MED ORDER — PHENOL 1.4 % MT LIQD: 1.0000 | OROMUCOSAL | Status: DC | PRN

## 2013-06-11 MED ORDER — ACETAMINOPHEN 650 MG RE SUPP: 650.0000 mg | RECTAL | Status: DC | PRN

## 2013-06-11 MED ORDER — METHOCARBAMOL 500 MG PO TABS
500.0000 mg | ORAL_TABLET | Freq: Four times a day (QID) | ORAL | Status: DC | PRN
  Administered 2013-06-11: 500 mg via ORAL
  Filled 2013-06-11: qty 1

## 2013-06-11 MED ORDER — BUPIVACAINE HCL (PF) 0.5 % IJ SOLN
INTRAMUSCULAR | Status: DC | PRN
  Administered 2013-06-11: 3 mL

## 2013-06-11 MED ORDER — LIDOCAINE HCL (CARDIAC) 20 MG/ML IV SOLN
INTRAVENOUS | Status: DC | PRN
  Administered 2013-06-11: 100 mg via INTRAVENOUS

## 2013-06-11 MED ORDER — CEFAZOLIN SODIUM 1-5 GM-% IV SOLN
1.0000 g | Freq: Three times a day (TID) | INTRAVENOUS | Status: DC
  Filled 2013-06-11: qty 50

## 2013-06-11 MED ORDER — ONDANSETRON HCL 4 MG/2ML IJ SOLN: 4.0000 mg | Freq: Once | INTRAMUSCULAR | Status: DC | PRN

## 2013-06-11 MED ORDER — GLYCOPYRROLATE 0.2 MG/ML IJ SOLN
INTRAMUSCULAR | Status: AC
  Filled 2013-06-11: qty 3

## 2013-06-11 SURGICAL SUPPLY — 78 items
ADH SKN CLS APL DERMABOND .7 (GAUZE/BANDAGES/DRESSINGS) ×1
ADH SKN CLS LQ APL DERMABOND (GAUZE/BANDAGES/DRESSINGS) ×1
APL SKNCLS STERI-STRIP NONHPOA (GAUZE/BANDAGES/DRESSINGS)
BAG DECANTER FOR FLEXI CONT (MISCELLANEOUS) ×3 IMPLANT
BENZOIN TINCTURE PRP APPL 2/3 (GAUZE/BANDAGES/DRESSINGS) IMPLANT
BIT DRILL NEURO 2X3.1 SFT TUCH (MISCELLANEOUS) ×1 IMPLANT
BIT MILLING PRODISC 2.0 STER (BIT) ×2 IMPLANT
BLADE 10 SAFETY STRL DISP (BLADE) ×3 IMPLANT
BLADE ULTRA TIP 2M (BLADE) IMPLANT
BNDG GAUZE ELAST 4 BULKY (GAUZE/BANDAGES/DRESSINGS) IMPLANT
BUR BARREL STRAIGHT FLUTE 4.0 (BURR) ×3 IMPLANT
CANISTER SUCT 3000ML (MISCELLANEOUS) ×3 IMPLANT
CLOSURE WOUND 1/2 X4 (GAUZE/BANDAGES/DRESSINGS)
CONT SPEC 4OZ CLIKSEAL STRL BL (MISCELLANEOUS) ×3 IMPLANT
COVER MAYO STAND STRL (DRAPES) ×3 IMPLANT
DERMABOND ADHESIVE PROPEN (GAUZE/BANDAGES/DRESSINGS) ×2
DERMABOND ADVANCED (GAUZE/BANDAGES/DRESSINGS) ×2
DERMABOND ADVANCED .7 DNX12 (GAUZE/BANDAGES/DRESSINGS) ×1 IMPLANT
DERMABOND ADVANCED .7 DNX6 (GAUZE/BANDAGES/DRESSINGS) IMPLANT
DISC PRODISC-C MED DEEP 5MM (Neuro Prosthesis/Implant) ×2 IMPLANT
DRAPE C-ARM 42X72 X-RAY (DRAPES) ×4 IMPLANT
DRAPE LAPAROTOMY 100X72 PEDS (DRAPES) ×3 IMPLANT
DRAPE MICROSCOPE LEICA (MISCELLANEOUS) ×3 IMPLANT
DRAPE POUCH INSTRU U-SHP 10X18 (DRAPES) ×3 IMPLANT
DRAPE PROXIMA HALF (DRAPES) IMPLANT
DRESSING TELFA 8X3 (GAUZE/BANDAGES/DRESSINGS) IMPLANT
DRILL NEURO 2X3.1 SOFT TOUCH (MISCELLANEOUS) ×3
DURAPREP 6ML APPLICATOR 50/CS (WOUND CARE) ×3 IMPLANT
ELECT COATED BLADE 2.86 ST (ELECTRODE) ×3 IMPLANT
ELECT REM PT RETURN 9FT ADLT (ELECTROSURGICAL) ×3
ELECTRODE REM PT RTRN 9FT ADLT (ELECTROSURGICAL) ×1 IMPLANT
GAUZE SPONGE 4X4 16PLY XRAY LF (GAUZE/BANDAGES/DRESSINGS) IMPLANT
GLOVE BIO SURGEON STRL SZ8 (GLOVE) ×3 IMPLANT
GLOVE BIOGEL M 8.0 STRL (GLOVE) ×2 IMPLANT
GLOVE BIOGEL PI IND STRL 8 (GLOVE) ×1 IMPLANT
GLOVE BIOGEL PI IND STRL 8.5 (GLOVE) ×1 IMPLANT
GLOVE BIOGEL PI INDICATOR 8 (GLOVE) ×2
GLOVE BIOGEL PI INDICATOR 8.5 (GLOVE) ×2
GLOVE ECLIPSE 8.0 STRL XLNG CF (GLOVE) ×3 IMPLANT
GLOVE EXAM NITRILE LRG STRL (GLOVE) ×2 IMPLANT
GLOVE EXAM NITRILE MD LF STRL (GLOVE) IMPLANT
GLOVE EXAM NITRILE XL STR (GLOVE) IMPLANT
GLOVE EXAM NITRILE XS STR PU (GLOVE) IMPLANT
GLOVE INDICATOR 7.5 STRL GRN (GLOVE) ×4 IMPLANT
GLOVE SURG SS PI 7.0 STRL IVOR (GLOVE) ×8 IMPLANT
GOWN BRE IMP SLV AUR LG STRL (GOWN DISPOSABLE) IMPLANT
GOWN BRE IMP SLV AUR XL STRL (GOWN DISPOSABLE) IMPLANT
GOWN STRL REIN 2XL LVL4 (GOWN DISPOSABLE) IMPLANT
GOWN STRL REUS W/ TWL LRG LVL3 (GOWN DISPOSABLE) IMPLANT
GOWN STRL REUS W/TWL 2XL LVL3 (GOWN DISPOSABLE) ×2 IMPLANT
GOWN STRL REUS W/TWL LRG LVL3 (GOWN DISPOSABLE) ×12
HALTER HD/CHIN CERV TRACTION D (MISCELLANEOUS) ×3 IMPLANT
KIT BASIN OR (CUSTOM PROCEDURE TRAY) ×3 IMPLANT
KIT ROOM TURNOVER OR (KITS) ×3 IMPLANT
NDL HYPO 18GX1.5 BLUNT FILL (NEEDLE) IMPLANT
NDL HYPO 25X1 1.5 SAFETY (NEEDLE) ×1 IMPLANT
NDL SPNL 22GX3.5 QUINCKE BK (NEEDLE) ×1 IMPLANT
NEEDLE HYPO 18GX1.5 BLUNT FILL (NEEDLE) IMPLANT
NEEDLE HYPO 25X1 1.5 SAFETY (NEEDLE) ×3 IMPLANT
NEEDLE SPNL 22GX3.5 QUINCKE BK (NEEDLE) ×3 IMPLANT
NS IRRIG 1000ML POUR BTL (IV SOLUTION) ×3 IMPLANT
PACK LAMINECTOMY NEURO (CUSTOM PROCEDURE TRAY) ×3 IMPLANT
PAD ARMBOARD 7.5X6 YLW CONV (MISCELLANEOUS) ×9 IMPLANT
PIN DISTRACTION 14MM (PIN) ×6 IMPLANT
RUBBERBAND STERILE (MISCELLANEOUS) ×6 IMPLANT
SPONGE GAUZE 4X4 12PLY (GAUZE/BANDAGES/DRESSINGS) IMPLANT
SPONGE INTESTINAL PEANUT (DISPOSABLE) ×3 IMPLANT
SPONGE SURGIFOAM ABS GEL SZ50 (HEMOSTASIS) ×3 IMPLANT
STAPLER SKIN PROX WIDE 3.9 (STAPLE) IMPLANT
STRIP CLOSURE SKIN 1/2X4 (GAUZE/BANDAGES/DRESSINGS) IMPLANT
SUT VIC AB 3-0 SH 8-18 (SUTURE) ×6 IMPLANT
SYR 20ML ECCENTRIC (SYRINGE) ×3 IMPLANT
SYR 3ML LL SCALE MARK (SYRINGE) IMPLANT
TIP INSERTER MEDIUM (INSTRUMENTS) ×2 IMPLANT
TOWEL OR 17X24 6PK STRL BLUE (TOWEL DISPOSABLE) ×3 IMPLANT
TOWEL OR 17X26 10 PK STRL BLUE (TOWEL DISPOSABLE) ×3 IMPLANT
TRAP SPECIMEN MUCOUS 40CC (MISCELLANEOUS) ×3 IMPLANT
WATER STERILE IRR 1000ML POUR (IV SOLUTION) ×3 IMPLANT

## 2013-06-11 NOTE — Progress Notes (Signed)
Came to visit patient at bedside on behalf of [facility] to [facility] program for [facility] employees/dependents with [facility] insurance. He is familiar with [facility] and expresses appreciation for assistance that was provided prior to surgery. Explained [employer] to [employer] program. He declines having any current needs for Link to Wellness however. Provided brochure and contact information. He is agreeable to post hospital discharge phone call. Confirmed best number for him. Appreciative of bedside visit.  Raiford NobleAtika Hall, MSN- RN,BSN- [facility] Care Management Hospital Liaison223 [phone]

## 2013-06-11 NOTE — Op Note (Signed)
06/11/2013  9:52 AM  PATIENT:  Gary Ramos  48 y.o. 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   PROCEDURE:  Procedure(s): CERVICAL SIX TO SEVEN PRODISC ARTIFICIAL CERVICAL DISC REPLACEMENT (N/A)  SURGEON:  Surgeon(s) and Role:    * Maeola HarmanJoseph Gervis Gaba, MD - Primary    * Karn CassisErnesto M Botero, MD - Assisting  PHYSICIAN ASSISTANT:   ASSISTANTS: Poteat, RN   ANESTHESIA:   general  EBL:  Total I/O In: 2000 [I.V.:2000] Out: -   BLOOD ADMINISTERED:none  DRAINS: none   LOCAL MEDICATIONS USED:  MARCAINE     SPECIMEN:  No Specimen  DISPOSITION OF SPECIMEN:  N/A  COUNTS:  YES  TOURNIQUET:  * No tourniquets in log *  DICTATION: DICTATION: Patient was brought to operating room and following the smooth and uncomplicated induction of general endotracheal anesthesia his head was placed on a donut head holder and his anterior neck was prepped and draped in usual sterile fashion. An incision was made on the left side of midline after infiltrating the skin and subcutaneous tissues with local lidocaine. The platysmal layer was incised and subplatysmal dissection was performed exposing the anterior border sternocleidomastoid muscle. Using blunt dissection the carotid sheath was kept lateral and trachea and esophagus kept medial exposing the anterior cervical spine. A bent spinal needle was placed it was felt to be the C6-7 level and this was confirmed on intraoperative x-ray. Longus coli muscles were taken down from the anterior cervical spine using electrocautery and key elevator and self-retaining retractor was placed. The interspace was incised and a thorough discectomy was performed. Synthes 14 mm Distraction pins were placed. The spinal cord dura and both C7 nerve roots were widely decompressed. A large ruptured disc was removed which was  compressing the left C 7 nerve root.  Hemostasis was assured. After trial sizing a medium deep 5 mm Prodisc C implant was sized and countersunk aoppropriately. The milling guide was placed and keel cuts were made.  The chisel was used to clear the keel cuts.  A medium deep Prodisc C implant was placed.  It's positioning was confirmed with AP and lateral fluoroscopy.  Keel cuts were waxed.  Soft tissues were inspected and found to be in good repair. The wound was irrigated. The platysma layer was closed with 3-0 Vicryl stitches and the skin was reapproximated with 3-0 Vicryl subcuticular stitches. The wound was dressed with Dermabond. Counts were correct at the end of the case. Patient was extubated and taken to recovery in stable and satisfactory condition.  PLAN OF CARE: Admit for overnight observation  PATIENT DISPOSITION:  PACU - hemodynamically stable.   Delay start of Pharmacological VTE agent (>24hrs) due to surgical blood loss or risk of bleeding: yes

## 2013-06-11 NOTE — Progress Notes (Signed)
Orthopedic Tech Progress Note Patient Details:  Gary DullJoseph A Ramos [birth year] [id number] Replacement soft collar provided. Ortho Devices Type of Ortho Device: Soft collar Ortho Device/Splint Interventions: Ordered   [name] 06/11/2013, 3:13 PM

## 2013-06-11 NOTE — Transfer of Care (Signed)
Immediate Anesthesia Transfer of Care Note  Patient: Gary Ramos  Procedure(s) Performed: Procedure(s): CERVICAL SIX TO SEVEN PRODISC ARTIFICIAL CERVICAL DISC REPLACEMENT (N/A)  Patient Location: PACU  Anesthesia Type:General  Level of Consciousness: awake, alert , oriented and sedated  Airway & Oxygen Therapy: Patient Spontanous Breathing and Patient connected to nasal cannula oxygen  Post-op Assessment: Report given to PACU RN, Post -op Vital signs reviewed and stable and Patient moving all extremities  Post vital signs: Reviewed and stable  Complications: No apparent anesthesia complications

## 2013-06-11 NOTE — Brief Op Note (Signed)
06/11/2013  9:52 AM  PATIENT:  Gary Ramos  48 y.o. 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   PROCEDURE:  Procedure(s): CERVICAL SIX TO SEVEN PRODISC ARTIFICIAL CERVICAL DISC REPLACEMENT (N/A)  SURGEON:  Surgeon(s) and Role:    * Jaycee Danell Vazquez, MD - Primary    * Ernesto M Botero, MD - Assisting  PHYSICIAN ASSISTANT:   ASSISTANTS: Poteat, RN   ANESTHESIA:   general  EBL:  Total I/O In: 2000 [I.V.:2000] Out: -   BLOOD ADMINISTERED:none  DRAINS: none   LOCAL MEDICATIONS USED:  MARCAINE     SPECIMEN:  No Specimen  DISPOSITION OF SPECIMEN:  N/A  COUNTS:  YES  TOURNIQUET:  * No tourniquets in log *  DICTATION: DICTATION: Patient was brought to operating room and following the smooth and uncomplicated induction of general endotracheal anesthesia his head was placed on a donut head holder and his anterior neck was prepped and draped in usual sterile fashion. An incision was made on the left side of midline after infiltrating the skin and subcutaneous tissues with local lidocaine. The platysmal layer was incised and subplatysmal dissection was performed exposing the anterior border sternocleidomastoid muscle. Using blunt dissection the carotid sheath was kept lateral and trachea and esophagus kept medial exposing the anterior cervical spine. A bent spinal needle was placed it was felt to be the C6-7 level and this was confirmed on intraoperative x-ray. Longus coli muscles were taken down from the anterior cervical spine using electrocautery and key elevator and self-retaining retractor was placed. The interspace was incised and a thorough discectomy was performed. Synthes 14 mm Distraction pins were placed. The spinal cord dura and both C7 nerve roots were widely decompressed. A large ruptured disc was removed which was  compressing the left C 7 nerve root.  Hemostasis was assured. After trial sizing a medium deep 5 mm Prodisc C implant was sized and countersunk aoppropriately. The milling guide was placed and keel cuts were made.  The chisel was used to clear the keel cuts.  A medium deep Prodisc C implant was placed.  It's positioning was confirmed with AP and lateral fluoroscopy.  Keel cuts were waxed.  Soft tissues were inspected and found to be in good repair. The wound was irrigated. The platysma layer was closed with 3-0 Vicryl stitches and the skin was reapproximated with 3-0 Vicryl subcuticular stitches. The wound was dressed with Dermabond. Counts were correct at the end of the case. Patient was extubated and taken to recovery in stable and satisfactory condition.  PLAN OF CARE: Admit for overnight observation  PATIENT DISPOSITION:  PACU - hemodynamically stable.   Delay start of Pharmacological VTE agent (>24hrs) due to surgical blood loss or risk of bleeding: yes  

## 2013-06-11 NOTE — Discharge Summary (Signed)
Physician Discharge Summary  Patient ID: Gary Ramos MRN: [id number] DOB/AGE: Jun 01, 1965 48 y.o.  Admit date: 06/11/2013 Discharge date: 06/11/2013  Admission Diagnoses:Herniated cervical disc C 67 with Radiculopathy  Discharge Diagnoses: Herniated cervical disc C 67 with Radiculopathy Active Problems:   Herniated cervical disc without myelopathy   Discharged Condition: good  Hospital Course: Uncomplicated anterior cervical decompression C 67 with Prodisc C arthroplasty  Consults: None  Significant Diagnostic Studies: None  Treatments: surgery: anterior cervical decompression C 67 with Prodisc C arthroplasty  Discharge Exam: Blood pressure 126/84, pulse 68, temperature 97.8 F (36.6 C), temperature source Oral, resp. rate 18, SpO2 96.00%. Neurologic: Alert and oriented X 3, normal strength and tone. Normal symmetric reflexes. Normal coordination and gait Wound:CDI  Disposition: Home     Medication List         HYDROcodone-acetaminophen 5-325 MG per tablet  Commonly known as:  NORCO/VICODIN  Take 1 tablet by mouth every 4 (four) hours as needed for moderate pain (Takes one tablet every 4-6 hours as needed for pain).     ibuprofen 200 MG tablet  Commonly known as:  ADVIL,MOTRIN  Take 800 mg by mouth every 6 (six) hours as needed.     lansoprazole 30 MG capsule  Commonly known as:  PREVACID  Take 30 mg by mouth daily at 12 noon.     lisinopril 10 MG tablet  Commonly known as:  PRINIVIL,ZESTRIL  Take 10 mg by mouth daily.     methocarbamol 500 MG tablet  Commonly known as:  ROBAXIN  Take 1 tablet (500 mg total) by mouth every 6 (six) hours as needed for muscle spasms.     traMADol 50 MG tablet  Commonly known as:  ULTRAM  Take 50 mg by mouth every 8 (eight) hours as needed for moderate pain.         Signed: Maeola HarmanJoseph Tomeeka Plaugher, MD 06/11/2013, 3:43 PM

## 2013-06-11 NOTE — Progress Notes (Signed)
Pt doing well. Pt given D/C instructions with Rx, verbal understanding was given. Pt's IV was removed prior to D/C. Pt D/C'd home via walking per MD order. Pt is stable @ D/C and has no other needs. Rema FendtAshley Kaniyah Lisby, RN

## 2013-06-11 NOTE — Addendum Note (Signed)
Addendum created 06/11/13 1128 by Fransisca KaufmannMary E Rumor Sun, CRNA   Modules edited: Anesthesia Flowsheet

## 2013-06-11 NOTE — Anesthesia Preprocedure Evaluation (Signed)
Anesthesia Evaluation  Patient identified by MRN, date of birth, ID band Patient awake    Reviewed: Allergy & Precautions, H&P , NPO status , Patient's Chart, lab work & pertinent test results  Airway Mallampati: II TM Distance: >3 FB Neck ROM: Full    Dental  (+) Teeth Intact, Dental Advisory Given   Pulmonary  breath sounds clear to auscultation        Cardiovascular hypertension, Rhythm:Regular Rate:Normal     Neuro/Psych    GI/Hepatic   Endo/Other    Renal/GU      Musculoskeletal   Abdominal   Peds  Hematology   Anesthesia Other Findings   Reproductive/Obstetrics                           Anesthesia Physical Anesthesia Plan  ASA: II  Anesthesia Plan: General   Post-op Pain Management:    Induction: Intravenous  Airway Management Planned: Oral ETT  Additional Equipment:   Intra-op Plan:   Post-operative Plan: Extubation in OR  Informed Consent: I have reviewed the patients History and Physical, chart, labs and discussed the procedure including the risks, benefits and alternatives for the proposed anesthesia with the patient or authorized representative who has indicated his/her understanding and acceptance.   Dental advisory given  Plan Discussed with: CRNA and Anesthesiologist  Anesthesia Plan Comments: (HNP C6-7 Htn  Plan GA with oral ETT  Kipp Broodavid Lundon Rosier)        Anesthesia Quick Evaluation

## 2013-06-11 NOTE — Anesthesia Postprocedure Evaluation (Signed)
  Anesthesia Post-op Note  Patient: Gary Ramos  Procedure(s) Performed: Procedure(s): CERVICAL SIX TO SEVEN PRODISC ARTIFICIAL CERVICAL DISC REPLACEMENT (N/A)  Patient Location: PACU  Anesthesia Type:General  Level of Consciousness: awake, alert  and oriented  Airway and Oxygen Therapy: Patient Spontanous Breathing and Patient connected to nasal cannula oxygen  Post-op Pain: mild  Post-op Assessment: Post-op Vital signs reviewed, Patient's Cardiovascular Status Stable, Respiratory Function Stable, Patent Airway and Pain level controlled  Post-op Vital Signs: stable  Last Vitals:  Filed Vitals:   06/11/13 1045  BP: 126/84  Pulse: 68  Temp: 36.6 C  Resp: 18    Complications: No apparent anesthesia complications

## 2013-06-11 NOTE — Progress Notes (Signed)
Patient awake, alert, conversant.  Full strength both upper extremities.  MAEW.  Doing well.

## 2013-06-15 ENCOUNTER — Encounter (HOSPITAL_COMMUNITY): Payer: Self-pay | Admitting: Neurosurgery

## 2014-11-03 ENCOUNTER — Telehealth: Payer: Self-pay | Admitting: Internal Medicine

## 2014-11-03 DIAGNOSIS — G4733 Obstructive sleep apnea (adult) (pediatric): Secondary | ICD-10-CM

## 2014-11-03 NOTE — Telephone Encounter (Signed)
Ok to identify his DME, order replacement CPAP mask of choice and supplies     Dx OSA           Make an appointment to establish with any of our sleep docs           Please track down his original diagnositc sleep study and make sure it is in [employer]

## 2014-11-03 NOTE — Telephone Encounter (Signed)
Spoke with pt. Order has been placed for supplies. OV has been made with CY per the pt's request for 03/20/14 at 9am. Nothing further was needed at this time.

## 2014-11-03 NOTE — Telephone Encounter (Signed)
Spoke with pt. States that he had a sleep study back in 2007. He can't remember who the ordering physician was for the study. CY interrupted the study. He needs new CPAP supplies. I asked him who had been ordering his supplies for him since 2007 and he stated that he has been using the same supplies since 2007. We would like to know if CY will order supplies for him.  CY - please advise. Thanks.

## 2014-11-28 ENCOUNTER — Ambulatory Visit (INDEPENDENT_AMBULATORY_CARE_PROVIDER_SITE_OTHER): Payer: 59 | Admitting: Internal Medicine

## 2014-11-28 ENCOUNTER — Encounter: Payer: Self-pay | Admitting: Internal Medicine

## 2014-11-28 VITALS — BP 122/78 | HR 71 | Ht 69.0 in | Wt 237.0 lb

## 2014-11-28 DIAGNOSIS — G4733 Obstructive sleep apnea (adult) (pediatric): Secondary | ICD-10-CM | POA: Insufficient documentation

## 2014-11-28 NOTE — Patient Instructions (Addendum)
Order- [facility]- replacement for old CPAP machine, mask of choice, humidifier, supplies, Air View   Dx OSA   Please call as needed

## 2014-11-28 NOTE — Progress Notes (Signed)
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 Needs supplies and newer CPAP machine as well. NPSG 06/05/05- daytime study for wowk schedule- AHI 105.7/ hr, CPAP to 9, weight 225 lbs He has been using CPAP all night every night with good control of snoring and sleepiness. Machine is now wearing out. ENT-repair nasal fracture with turbinate reduction/Dr. Lazarus Salines  Prior to Admission medications   Medication Sig Start Date End Date Taking? Authorizing Provider  HYDROcodone-acetaminophen (NORCO/VICODIN) 5-325 MG per tablet Take 1 tablet by mouth every 4 (four) hours as needed for moderate pain (Takes one tablet every 4-6 hours as needed for pain).   Yes Historical Provider, MD  ibuprofen (ADVIL,MOTRIN) 200 MG tablet Take 800 mg by mouth every 6 (six) hours as needed.    Yes Historical Provider, MD  lansoprazole (PREVACID) 30 MG capsule Take 30 mg by mouth daily at 12 noon.   Yes Historical Provider, MD  methocarbamol (ROBAXIN) 500 MG tablet Take 1 tablet (500 mg total) by mouth every 6 (six) hours as needed for muscle spasms. 06/11/13  Yes Maeola Harman, MD  traMADol (ULTRAM) 50 MG tablet Take 50 mg by mouth every 8 (eight) hours as needed for moderate pain.    Yes Historical Provider, MD   Past Medical History  Diagnosis Date  . Hypertension   . Sleep apnea     does not wear cpap  . GERD (gastroesophageal reflux disease)    Past Surgical History  Procedure Laterality Date  . Bowel resection    . Rhinoplasty    . Anterior cervical decomp/discectomy fusion N/A 06/11/2013    Procedure: CERVICAL SIX TO SEVEN PRODISC ARTIFICIAL CERVICAL DISC REPLACEMENT;  Surgeon: Maeola Harman, MD;  Location: [facility];  Service: Neurosurgery;  Laterality: N/A;   No family history on file. Social History   Social History  . Marital Status: Married    Spouse Name: N/A  . Number of Children: N/A  . Years of Education:  N/A   Occupational History  . Not on file.   Social History Main Topics  . Smoking status: Never Smoker   . Smokeless tobacco: Not on file  . Alcohol Use: No  . Drug Use: No  . Sexual Activity: Not on file   Other Topics Concern  . Not on file   Social History Narrative   ROS-see HPI   Negative unless "+" Constitutional:    weight loss, night sweats, fevers, chills, fatigue, lassitude. HEENT:    headaches, difficulty swallowing, tooth/dental problems, sore throat,       sneezing, itching, ear ache, nasal congestion, post nasal drip, snoring CV:    chest pain, orthopnea, PND, swelling in lower extremities, anasarca,                                                  dizziness, palpitations Resp:   shortness of breath with exertion or at rest.                productive cough,   non-productive cough, coughing up of blood.              change in color of mucus.  wheezing.   Skin:    rash or lesions. GI:  No-   heartburn, indigestion,  abdominal pain, nausea, vomiting,  GU:  MS:   joint pain, stiffness, decreased range of motion, back pain. Neuro-     nothing unusual Psych:  change in mood or affect.  depression or anxiety.   memory loss.  OBJ- Physical Exam General- Alert, Oriented, Affect-appropriate, Distress- none acute Skin- rash-none, lesions- none, excoriation- none Lymphadenopathy- none Head- atraumatic            Eyes- Gross vision intact, PERRLA, conjunctivae and secretions clear            Ears- Hearing, canals-normal            Nose- Clear, no-Septal dev, mucus+ crusting, polyps, erosion, perforation             Throat- Mallampati III-IV , mucosa clear , drainage- none, tonsils- atrophic Neck- flexible , trachea midline, no stridor , thyroid nl, carotid no bruit Chest - symmetrical excursion , unlabored           Heart/CV- RRR , no murmur , no gallop  , no rub, nl s1 s2                           - JVD- none , edema- none, stasis changes- none, varices- none            Lung- clear to P&A, wheeze- none, cough- none , dullness-none, rub- none           Chest wall-  Abd-  Br/ Gen/ Rectal- Not done, not indicated Extrem- cyanosis- none, clubbing, none, atrophy- none, strength- nl Neuro- grossly intact to observation

## 2014-11-28 NOTE — Assessment & Plan Note (Signed)
He has demonstrated excellent compliance and control with improve quality of life. Machine is now worn out. It is time to get him reestablished and we will add Air View to permit compliance for management. Plan-replacement CPAP equipment as discussed.

## 2014-12-15 ENCOUNTER — Other Ambulatory Visit (HOSPITAL_COMMUNITY): Payer: Self-pay | Admitting: Internal Medicine

## 2014-12-15 DIAGNOSIS — G8929 Other chronic pain: Secondary | ICD-10-CM

## 2014-12-15 DIAGNOSIS — M549 Dorsalgia, unspecified: Principal | ICD-10-CM

## 2014-12-28 ENCOUNTER — Ambulatory Visit (HOSPITAL_COMMUNITY): Admission: RE | Admit: 2014-12-28 | Payer: 59 | Source: Ambulatory Visit | Admitting: Internal Medicine

## 2015-01-05 ENCOUNTER — Ambulatory Visit (HOSPITAL_COMMUNITY): Payer: 59

## 2015-01-06 ENCOUNTER — Ambulatory Visit (HOSPITAL_COMMUNITY)
Admission: RE | Admit: 2015-01-06 | Discharge: 2015-01-06 | Disposition: A | Discharge status: Home / Self Care | Payer: 59 | Source: Ambulatory Visit | Attending: Internal Medicine | Admitting: Internal Medicine

## 2015-01-06 DIAGNOSIS — M4806 Spinal stenosis, lumbar region: Secondary | ICD-10-CM | POA: Insufficient documentation

## 2015-01-06 DIAGNOSIS — G8929 Other chronic pain: Secondary | ICD-10-CM | POA: Insufficient documentation

## 2015-01-06 DIAGNOSIS — M549 Dorsalgia, unspecified: Secondary | ICD-10-CM | POA: Diagnosis present

## 2015-02-03 DIAGNOSIS — M5440 Lumbago with sciatica, unspecified side: Secondary | ICD-10-CM | POA: Diagnosis not present

## 2015-02-03 DIAGNOSIS — I10 Essential (primary) hypertension: Secondary | ICD-10-CM | POA: Diagnosis not present

## 2015-02-24 MED FILL — VIAGRA 100 MG TABLET: 100 | 30 days supply | Qty: 6 | Fill #0

## 2015-03-13 MED FILL — LANSOPRAZOLE DR 30 MG CAP: 30 | 90 days supply | Qty: 90 | Fill #0

## 2015-03-15 DIAGNOSIS — K5730 Diverticulosis of large intestine without perforation or abscess without bleeding: Secondary | ICD-10-CM | POA: Diagnosis not present

## 2015-03-15 DIAGNOSIS — Z8371 Family history of colonic polyps: Secondary | ICD-10-CM | POA: Diagnosis not present

## 2015-03-21 ENCOUNTER — Institutional Professional Consult (permissible substitution): Payer: 59 | Admitting: Internal Medicine

## 2015-08-07 MED FILL — LANSOPRAZOLE DR 30 MG CAP: 30 | 90 days supply | Qty: 90 | Fill #1

## 2015-09-19 DIAGNOSIS — M549 Dorsalgia, unspecified: Secondary | ICD-10-CM | POA: Diagnosis not present

## 2015-09-19 DIAGNOSIS — S39012A Strain of muscle, fascia and tendon of lower back, initial encounter: Secondary | ICD-10-CM | POA: Diagnosis not present

## 2015-09-19 MED FILL — diazePAM 5 MG TABS: 5 | 14 days supply | Qty: 28 | Fill #0

## 2015-09-19 MED FILL — OXYCODONE/APAP 5/325MG: 5-325 | 14 days supply | Qty: 42 | Fill #0

## 2015-11-18 MED FILL — LANSOPRAZOLE DR 30 MG CAPSU: 30 | 90 days supply | Qty: 90 | Fill #2

## 2015-11-28 ENCOUNTER — Encounter: Payer: Self-pay | Admitting: Internal Medicine

## 2015-11-28 ENCOUNTER — Ambulatory Visit (INDEPENDENT_AMBULATORY_CARE_PROVIDER_SITE_OTHER): Payer: 59 | Admitting: Internal Medicine

## 2015-11-28 VITALS — BP 122/72 | HR 65 | Ht 69.0 in | Wt 235.2 lb

## 2015-11-28 DIAGNOSIS — G4733 Obstructive sleep apnea (adult) (pediatric): Secondary | ICD-10-CM | POA: Diagnosis not present

## 2015-11-28 MED ORDER — ESZOPICLONE 2 MG PO TABS: 2.0000 mg | ORAL_TABLET | Freq: Every evening | ORAL | 5 refills | Status: DC | PRN

## 2015-11-28 MED FILL — ESZOPICLONE 2 MG TABLET: 2 | 30 days supply | Qty: 30 | Fill #0

## 2015-11-28 NOTE — Progress Notes (Signed)
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 Needs supplies and newer CPAP machine as well. NPSG 06/05/05- daytime study for work schedule- AHI 105.7/ hr, CPAP to 9, weight 225 lbs He has been using CPAP all night every night with good control of snoring and sleepiness. Machine is now wearing out. ENT-repair nasal fracture with turbinate reduction/Dr. 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 Pt wears CPAP as much as possible; has trouble with positioning and comfort-face mask not working for patient. Pt will need new supplies.  Not comfortable, so not using CPAP regularly. Back pain with recurrent spasm so he tends to lie on his sides. Nasal pillows mask is uncomfortable. He hasn't had a new mask and a very long time and machine may be old enough to qualify for replacement. He had talked with ENT at time of septoplasty/rhinoplasty 4 years ago and was told OSA surgery was not advised. Valium for back spasm uses very occasionally definitely improved since sleep quality.  ROS-see HPI   Negative unless "+" Constitutional:    weight loss, night sweats, fevers, chills, fatigue, lassitude. HEENT:    headaches, difficulty swallowing, tooth/dental problems, sore throat,       sneezing, itching, ear ache, nasal congestion, post nasal drip, snoring CV:    chest pain, orthopnea, PND, swelling in lower extremities, anasarca,                                                  dizziness, palpitations Resp:   shortness of breath with exertion or at rest.                productive cough,   non-productive cough, coughing up of blood.              change in color of mucus.  wheezing.   Skin:    rash or lesions. GI:  No-   heartburn, indigestion, abdominal pain, nausea, vomiting,  GU:  MS:   joint pain, stiffness, decreased range of motion, +back pain. Neuro-      nothing unusual Psych:  change in mood or affect.  depression or anxiety.   memory loss.  OBJ- Physical Exam General- Alert, Oriented, Affect-appropriate, Distress- none acute Skin- rash-none, lesions- none, excoriation- none Lymphadenopathy- none Head- atraumatic            Eyes- Gross vision intact, PERRLA, conjunctivae and secretions clear            Ears- Hearing, canals-normal            Nose- Clear, no-Septal dev, mucus, polyps, erosion, perforation             Throat- Mallampati III-IV , mucosa clear , drainage- none, tonsils- atrophic Neck- flexible , trachea midline, no stridor , thyroid nl, carotid no bruit Chest - symmetrical excursion , unlabored           Heart/CV- RRR , no murmur , no gallop  , no rub, nl s1 s2                           - JVD- none , edema- none, stasis changes- none, varices- none  Lung- clear to P&A, wheeze- none, cough- none , dullness-none, rub- none           Chest wall-  Abd-  Br/ Gen/ Rectal- Not done, not indicated Extrem- cyanosis- none, clubbing, none, atrophy- none, strength- nl Neuro- grossly intact to observation

## 2015-11-28 NOTE — Assessment & Plan Note (Signed)
Not using CPAP regularly. Part of the problem was back pain disturbing his sleep for which he found Valium was helpful. Also mask doesn't fit well. Although his initial AHI was very high, he may still do better with an oral appliance then with nothing at all if we can't get CPAP if it better. Plan-try Lunesta for sleep quality. Referral to Dr. Myrtis SerKatz to consider oral appliance option. DME to work with him on his CPAP use and comfort.

## 2015-11-28 NOTE — Patient Instructions (Addendum)
Order- D[facility]  Continue CPAP 9 or change to auto 5-15 if available, mask of choice, humidifier, supplies, AirView. Please help fit for more comfortable mask with supplies  now.        Dx OSA  Order- referral to orthodontist Dr Althea GrimmerMark Katz     Dx OSA  Script to try lunesta  Please call as needed

## 2015-12-12 ENCOUNTER — Telehealth: Payer: Self-pay | Admitting: Internal Medicine

## 2015-12-12 NOTE — Telephone Encounter (Signed)
[facility] [place] [facility]

## 2015-12-14 NOTE — Telephone Encounter (Signed)
LM for Gary Ramos/[facility] 

## 2015-12-19 NOTE — Telephone Encounter (Signed)
Per Barbara CowerJason from [facility]---[facility]  He is going to shop around. Nothing further is needed.

## 2016-01-17 DIAGNOSIS — H524 Presbyopia: Secondary | ICD-10-CM | POA: Diagnosis not present

## 2016-03-29 ENCOUNTER — Other Ambulatory Visit (HOSPITAL_COMMUNITY): Payer: Self-pay | Admitting: Internal Medicine

## 2016-03-29 ENCOUNTER — Ambulatory Visit (HOSPITAL_COMMUNITY)
Admission: RE | Admit: 2016-03-29 | Discharge: 2016-03-29 | Disposition: A | Discharge status: Home / Self Care | Payer: 59 | Source: Ambulatory Visit | Attending: Internal Medicine | Admitting: Internal Medicine

## 2016-03-29 ENCOUNTER — Other Ambulatory Visit: Payer: Self-pay | Admitting: Internal Medicine

## 2016-03-29 DIAGNOSIS — N50819 Testicular pain, unspecified: Secondary | ICD-10-CM

## 2016-03-29 DIAGNOSIS — N50812 Left testicular pain: Secondary | ICD-10-CM

## 2016-04-01 ENCOUNTER — Ambulatory Visit (HOSPITAL_COMMUNITY): Payer: 59

## 2016-08-21 MED FILL — LANSOPRAZOLE 30 MG CAP: 30 | 90 days supply | Qty: 90 | Fill #0

## 2016-09-12 ENCOUNTER — Other Ambulatory Visit (HOSPITAL_COMMUNITY): Payer: Self-pay | Admitting: Internal Medicine

## 2016-09-12 ENCOUNTER — Ambulatory Visit (HOSPITAL_COMMUNITY)
Admission: RE | Admit: 2016-09-12 | Discharge: 2016-09-12 | Disposition: A | Discharge status: Home / Self Care | Payer: 59 | Source: Ambulatory Visit | Attending: Internal Medicine | Admitting: Internal Medicine

## 2016-09-12 DIAGNOSIS — R918 Other nonspecific abnormal finding of lung field: Secondary | ICD-10-CM | POA: Insufficient documentation

## 2016-09-12 DIAGNOSIS — R0602 Shortness of breath: Secondary | ICD-10-CM

## 2016-09-12 DIAGNOSIS — Z0000 Encounter for general adult medical examination without abnormal findings: Secondary | ICD-10-CM | POA: Diagnosis not present

## 2016-09-12 MED ORDER — IOPAMIDOL (ISOVUE-370) INJECTION 76%
100.0000 mL | Freq: Once | INTRAVENOUS | Status: AC | PRN
  Administered 2016-09-12: 100 mL via INTRAVENOUS

## 2016-09-13 MED FILL — metFORMIN HCL 500 MG TABS: 500 | 30 days supply | Qty: 30 | Fill #0

## 2016-09-13 MED FILL — levoFLOXacin 500 MG TABS: 500 | 7 days supply | Qty: 7 | Fill #0

## 2016-10-31 MED FILL — metFORMIN HCL 500 MG TABS: 500 | 30 days supply | Qty: 30 | Fill #1

## 2016-11-14 DIAGNOSIS — R739 Hyperglycemia, unspecified: Secondary | ICD-10-CM | POA: Diagnosis not present

## 2016-11-14 DIAGNOSIS — I10 Essential (primary) hypertension: Secondary | ICD-10-CM | POA: Diagnosis not present

## 2016-11-14 DIAGNOSIS — Z0000 Encounter for general adult medical examination without abnormal findings: Secondary | ICD-10-CM | POA: Diagnosis not present

## 2016-11-14 DIAGNOSIS — J180 Bronchopneumonia, unspecified organism: Secondary | ICD-10-CM | POA: Diagnosis not present

## 2016-11-14 DIAGNOSIS — F419 Anxiety disorder, unspecified: Secondary | ICD-10-CM | POA: Diagnosis not present

## 2016-11-14 DIAGNOSIS — R0609 Other forms of dyspnea: Secondary | ICD-10-CM | POA: Diagnosis not present

## 2016-11-15 MED FILL — HYDROCODONE-CHLORPHENIRAM S: 10-8 | 30 days supply | Qty: 300 | Fill #0

## 2016-11-15 MED FILL — diazePAM 5 MG TABS: 5 | 30 days supply | Qty: 60 | Fill #0

## 2016-11-15 MED FILL — SILDENAFIL CITRATE 100 MG T: 100 | 30 days supply | Qty: 6 | Fill #0

## 2016-11-19 ENCOUNTER — Telehealth: Payer: Self-pay | Admitting: Cardiology

## 2016-11-19 NOTE — Telephone Encounter (Signed)
[facility] 12/11/16 with Dr Herbie BaltimoreHarding.  Records put with Dr Elissa HeftyHarding's schedule for 12/11/16. lp

## 2016-12-05 MED FILL — LANSOPRAZOLE 30 MG CAP: 30 | 90 days supply | Qty: 90 | Fill #0

## 2016-12-09 ENCOUNTER — Other Ambulatory Visit (HOSPITAL_COMMUNITY): Payer: Self-pay | Admitting: Internal Medicine

## 2016-12-09 DIAGNOSIS — J180 Bronchopneumonia, unspecified organism: Secondary | ICD-10-CM

## 2016-12-10 ENCOUNTER — Encounter (HOSPITAL_COMMUNITY): Payer: Self-pay

## 2016-12-10 ENCOUNTER — Other Ambulatory Visit (HOSPITAL_COMMUNITY): Payer: Self-pay | Admitting: Radiology

## 2016-12-10 ENCOUNTER — Other Ambulatory Visit: Payer: Self-pay | Admitting: Internal Medicine

## 2016-12-10 ENCOUNTER — Ambulatory Visit (HOSPITAL_COMMUNITY)
Admission: RE | Admit: 2016-12-10 | Discharge: 2016-12-10 | Disposition: A | Discharge status: Home / Self Care | Payer: 59 | Source: Ambulatory Visit | Attending: Internal Medicine | Admitting: Internal Medicine

## 2016-12-10 DIAGNOSIS — J189 Pneumonia, unspecified organism: Secondary | ICD-10-CM | POA: Insufficient documentation

## 2016-12-10 DIAGNOSIS — Z09 Encounter for follow-up examination after completed treatment for conditions other than malignant neoplasm: Secondary | ICD-10-CM | POA: Insufficient documentation

## 2016-12-10 DIAGNOSIS — I10 Essential (primary) hypertension: Secondary | ICD-10-CM | POA: Diagnosis not present

## 2016-12-10 DIAGNOSIS — Z8701 Personal history of pneumonia (recurrent): Secondary | ICD-10-CM | POA: Diagnosis not present

## 2016-12-10 DIAGNOSIS — R739 Hyperglycemia, unspecified: Secondary | ICD-10-CM | POA: Diagnosis not present

## 2016-12-10 DIAGNOSIS — R911 Solitary pulmonary nodule: Secondary | ICD-10-CM

## 2016-12-10 MED ORDER — IOPAMIDOL (ISOVUE-300) INJECTION 61%
INTRAVENOUS | Status: AC
  Administered 2016-12-10: 75 mL via INTRAVENOUS
  Filled 2016-12-10: qty 75

## 2016-12-10 MED ORDER — IOPAMIDOL (ISOVUE-300) INJECTION 61%
75.0000 mL | Freq: Once | INTRAVENOUS | Status: AC | PRN
  Administered 2016-12-10: 75 mL via INTRAVENOUS

## 2016-12-11 ENCOUNTER — Telehealth: Payer: Self-pay

## 2016-12-11 ENCOUNTER — Ambulatory Visit: Payer: 59 | Admitting: Cardiology

## 2016-12-11 NOTE — Telephone Encounter (Signed)
CALLED [facility] TO GET NOTES.TALKED TO ASHLEY IN MEDICAL RECORDS AND SHE STATED THAT SHE HAD TO FIND THEM

## 2016-12-12 ENCOUNTER — Encounter: Payer: Self-pay | Admitting: Cardiovascular Disease

## 2016-12-12 ENCOUNTER — Ambulatory Visit: Payer: 59 | Admitting: Cardiovascular Disease

## 2016-12-12 VITALS — BP 132/90 | HR 57 | Ht 68.0 in | Wt 225.8 lb

## 2016-12-12 DIAGNOSIS — R0602 Shortness of breath: Secondary | ICD-10-CM | POA: Diagnosis not present

## 2016-12-12 NOTE — Progress Notes (Signed)
Cardiology Office Note Date:  12/12/2016   ID:  Gary DullJoseph A Dowland, DOB 1965/03/21, MRN [id number]  PCP:  Pearson GrippeKim, James, MD  Cardiologist:  Audelia HivesMichale Loys Shugars, MD    Chief Complaint  Patient presents with  . Shortness of Breath     History of Present Illness: Gary Ramos is a 51 y.o. male who presents for cardiac evaluation of shortness of breath, referred by Dr Selena BattenKim.  The patient experienced shortness of breath in August of this year when he was diagnosed with community-acquired pneumonia.  He was treated and a follow-up CT scan of the chest in November 2018 demonstrated resolution of abnormality seen previously.  The patient is here alone today.  He feels well.  He specifically denies symptoms of chest pain, shortness of breath, heart palpitations, leg swelling, orthopnea, or PND.  He is physically active and has no exertional symptoms at present.  He is lost about 20 pounds through diet and exercise.  The patient has a history of borderline blood pressure elevation.  He is not been on medication.  He has no known history of hyperlipidemia.  He has no family history of premature CAD.   Past Medical History:  Diagnosis Date  . GERD (gastroesophageal reflux disease)   . Hypertension   . Sleep apnea    does not wear cpap    Past Surgical History:  Procedure Laterality Date  . ANTERIOR CERVICAL DECOMP/DISCECTOMY FUSION N/A 06/11/2013   Procedure: CERVICAL SIX TO SEVEN PRODISC ARTIFICIAL CERVICAL DISC REPLACEMENT;  Surgeon: Maeola HarmanJoseph Stern, MD;  Location: [facility];  Service: Neurosurgery;  Laterality: N/A;  . BOWEL RESECTION    . RHINOPLASTY      Current Outpatient Medications  Medication Sig Dispense Refill  . chlorpheniramine-HYDROcodone (TUSSIONEX) 10-8 MG/5ML SUER Take 5 mLs as directed by mouth.  0  . diazepam (VALIUM) 5 MG tablet Take 5 mg every 6 (six) hours as needed by mouth for muscle spasms.     . eszopiclone (LUNESTA) 2 MG TABS tablet Take 1 tablet (2 mg total) by mouth  at bedtime as needed for sleep. Take immediately before bedtime 30 tablet 5  . lansoprazole (PREVACID) 30 MG capsule Take 30 mg by mouth daily at 12 noon.    . metFORMIN (GLUCOPHAGE) 500 MG tablet Take 500 mg daily by mouth.  2  . sildenafil (VIAGRA) 100 MG tablet Take 1 tablet as directed by mouth.  6   No current facility-administered medications for this visit.     Allergies:   Patient has no known allergies.   Social History:  The patient  reports that  has never smoked. he has never used smokeless tobacco. He reports that he does not drink alcohol or use drugs.   Family History:  The patient's family history includes Cancer - Lung in his paternal grandfather; Diabetes in his mother; Healthy in his father, sister, sister, sister, and sister; Other in his maternal grandmother; Renal Disease in his mother.   Father is 6589 with recent stent placement. Mother died of renal disease.   ROS:  Please see the history of present illness.  Otherwise, review of systems is positive for back pain, snoring.  All other systems are reviewed and negative.    PHYSICAL EXAM: VS:  BP 132/90   Pulse (!) 57   Ht 5\' 8"  (1.727 m)   Wt 225 lb 12.8 oz (102.4 kg)   BMI 34.33 kg/m  , BMI Body mass index is 34.33 kg/m. GEN: Well nourished,  well developed, in no acute distress  HEENT: normal  Neck: no JVD, no masses. No carotid bruits Cardiac: RRR without murmur or gallop     Respiratory:  clear to auscultation bilaterally, normal work of breathing GI: soft, nontender, nondistended, + BS MS: no deformity or atrophy  Ext: no pretibial edema, pedal pulses 2+= bilaterally Skin: warm and dry, no rash Neuro:  Strength and sensation are intact Psych: euthymic mood, full affect  EKG:  EKG is ordered today. The ekg ordered today shows sinus bradycardia 57 bpm  Recent Labs: No results found for requested labs within last 8760 hours.   Lipid Panel  No results found for: CHOL, TRIG, HDL, CHOLHDL, VLDL,  LDLCALC, LDLDIRECT    Wt Readings from Last 3 Encounters:  12/12/16 225 lb 12.8 oz (102.4 kg)  11/28/15 235 lb 3.2 oz (106.7 kg)  11/28/14 237 lb (107.5 kg)     Cardiac Studies Reviewed: CT Chest 12-10-2016: FINDINGS: Cardiovascular: Heart is normal size. Aorta is normal caliber.  Mediastinum/Nodes: No mediastinal, hilar, or axillary adenopathy. Trachea and esophagus are unremarkable.  Lungs/Pleura: Previously seen patchy ground-glass airspace disease in the right upper lobe has resolved. No confluent opacities or effusions.  Upper Abdomen: Imaging into the upper abdomen shows no acute findings.  Musculoskeletal: Chest wall soft tissues are unremarkable.No acute bony abnormality.  IMPRESSION: No acute cardiopulmonary disease. Resolution of the previously seen bronchopneumonia in the right upper lobe.  ASSESSMENT AND PLAN: Shortness of breath: Symptoms have resolved with weight loss and treatment of pneumonia.  He really does not have any exertional symptoms at present.  His EKG is within normal limits.  We discussed consideration of stress testing, but the patient preferred not to have any testing done unless symptoms recur.  I will be happy to see him back in the future if any problems arise.  I asked him to keep an eye on his blood pressure, but he states this is generally well controlled with average blood pressures in the 120s over 70-80s. I reviewed his CT chest studies as part of his evaluation today.   Current medicines are reviewed with the patient today.  The patient does not have concerns regarding medicines.  Labs/ tests ordered today include:   Orders Placed This Encounter  Procedures  . EKG 12-Lead    Disposition:   FU prn  Signed, Audelia HivesMichale Parris Signer, MD  12/12/2016 5:32 PM    [facility] [address], [place], [place]  [address] Phone: [phone]; Fax: [phone]

## 2016-12-12 NOTE — Patient Instructions (Signed)
Medication Instructions:  Your provider recommends that you continue on your current medications as directed. Please refer to the Current Medication list given to you today.    Labwork: None  Testing/Procedures: None  Follow-Up: Your provider recommends that you schedule a follow-up appointment AS NEEDED with Dr. Cooper.  Any Other Special Instructions Will Be Listed Below (If Applicable).     If you need a refill on your cardiac medications before your next appointment, please call your pharmacy.   

## 2016-12-13 DIAGNOSIS — E119 Type 2 diabetes mellitus without complications: Secondary | ICD-10-CM | POA: Diagnosis not present

## 2016-12-13 DIAGNOSIS — Z0000 Encounter for general adult medical examination without abnormal findings: Secondary | ICD-10-CM | POA: Diagnosis not present

## 2016-12-17 MED FILL — metFORMIN HCL 500 MG TABS: 500 | 30 days supply | Qty: 30 | Fill #2

## 2016-12-17 MED FILL — diazePAM 5 MG TABS: 5 | 30 days supply | Qty: 60 | Fill #1

## 2016-12-17 MED FILL — SILDENAFIL CITRATE 100 MG T: 100 | 30 days supply | Qty: 6 | Fill #1

## 2017-01-17 MED FILL — SILDENAFIL CITRATE 100 MG T: 100 | 30 days supply | Qty: 6 | Fill #2

## 2017-01-17 MED FILL — diazePAM 5 MG TABS: 5 | 30 days supply | Qty: 60 | Fill #2

## 2017-02-12 MED FILL — VIAGRA 100 MG TABLET: 100 | 30 days supply | Qty: 6 | Fill #3

## 2017-02-13 MED FILL — metFORMIN HCL 500 MG TABS: 500 | 30 days supply | Qty: 30 | Fill #0

## 2017-02-14 MED FILL — diazePAM 5 MG TABS: 5 | 30 days supply | Qty: 60 | Fill #3

## 2017-03-03 DIAGNOSIS — H52223 Regular astigmatism, bilateral: Secondary | ICD-10-CM | POA: Diagnosis not present

## 2017-03-03 DIAGNOSIS — H524 Presbyopia: Secondary | ICD-10-CM | POA: Diagnosis not present

## 2017-03-03 DIAGNOSIS — H5203 Hypermetropia, bilateral: Secondary | ICD-10-CM | POA: Diagnosis not present

## 2017-03-19 MED FILL — LANSOPRAZOLE 30 MG CAP: 30 | 90 days supply | Qty: 90 | Fill #1

## 2017-03-19 MED FILL — metFORMIN HCL 500 MG TABS: 500 | 90 days supply | Qty: 90 | Fill #1

## 2017-03-19 MED FILL — diazePAM 5 MG TABS: 5 | 30 days supply | Qty: 60 | Fill #4

## 2017-03-19 MED FILL — SILDENAFIL CITRATE 100 MG T: 100 | 90 days supply | Qty: 18 | Fill #4

## 2017-03-25 ENCOUNTER — Telehealth: Payer: 59 | Admitting: Nurse Practitioner

## 2017-03-25 DIAGNOSIS — J0100 Acute maxillary sinusitis, unspecified: Secondary | ICD-10-CM | POA: Diagnosis not present

## 2017-03-25 MED ORDER — AMOXICILLIN-POT CLAVULANATE 875-125 MG PO TABS: 1.0000 | ORAL_TABLET | Freq: Two times a day (BID) | ORAL | 0 refills | Status: DC

## 2017-03-25 NOTE — Progress Notes (Signed)

## 2017-04-15 DIAGNOSIS — E119 Type 2 diabetes mellitus without complications: Secondary | ICD-10-CM | POA: Diagnosis not present

## 2017-04-17 MED FILL — diazePAM 5 MG TABS: 5 | 30 days supply | Qty: 60 | Fill #5

## 2017-05-19 MED FILL — diazePAM 5 MG TABS: 5 | 30 days supply | Qty: 120 | Fill #0

## 2017-06-17 MED FILL — LANSOPRAZOLE 30 MG CAP: 30 | 90 days supply | Qty: 90 | Fill #2

## 2017-06-17 MED FILL — diazePAM 5 MG TABS: 5 | 30 days supply | Qty: 120 | Fill #1

## 2017-06-17 MED FILL — SILDENAFIL CITRATE 100 MG T: 100 | 90 days supply | Qty: 18 | Fill #5

## 2017-06-17 MED FILL — metFORMIN HCL 500 MG TABS: 500 | 90 days supply | Qty: 90 | Fill #2

## 2017-07-21 MED FILL — diazePAM 5 MG TABS: 5 | 30 days supply | Qty: 120 | Fill #2

## 2017-08-04 ENCOUNTER — Telehealth: Payer: 59 | Admitting: Family

## 2017-08-04 DIAGNOSIS — J029 Acute pharyngitis, unspecified: Secondary | ICD-10-CM

## 2017-08-04 MED ORDER — AMOXICILLIN-POT CLAVULANATE 875-125 MG PO TABS: 1.0000 | ORAL_TABLET | Freq: Two times a day (BID) | ORAL | 0 refills | Status: DC

## 2017-08-04 NOTE — Addendum Note (Signed)
Addended by: Worthy RancherWEBB, Charletha Dalpe B on: 08/04/2017 10:57 AM   Modules accepted: Orders

## 2017-08-04 NOTE — Progress Notes (Signed)

## 2017-08-18 MED FILL — diazePAM 5 MG TABS: 5 | 30 days supply | Qty: 120 | Fill #3

## 2017-10-08 MED FILL — metFORMIN HCL 500 MG TABS: 500 | 90 days supply | Qty: 90 | Fill #3

## 2017-10-08 MED FILL — LANSOPRAZOLE 30 MG CPDR: 30 | 90 days supply | Qty: 90 | Fill #3

## 2017-10-08 MED FILL — diazePAM 5 MG TABS: 5 | 30 days supply | Qty: 120 | Fill #4

## 2017-11-13 DIAGNOSIS — E119 Type 2 diabetes mellitus without complications: Secondary | ICD-10-CM | POA: Diagnosis not present

## 2017-11-13 DIAGNOSIS — Z0000 Encounter for general adult medical examination without abnormal findings: Secondary | ICD-10-CM | POA: Diagnosis not present

## 2017-11-14 MED FILL — SILDENAFIL CITRATE 100 MG T: 100 | 75 days supply | Qty: 15 | Fill #6

## 2017-11-24 DIAGNOSIS — F1190 Opioid use, unspecified, uncomplicated: Secondary | ICD-10-CM | POA: Diagnosis not present

## 2017-11-24 DIAGNOSIS — I10 Essential (primary) hypertension: Secondary | ICD-10-CM | POA: Diagnosis not present

## 2017-11-24 DIAGNOSIS — F419 Anxiety disorder, unspecified: Secondary | ICD-10-CM | POA: Diagnosis not present

## 2017-11-24 DIAGNOSIS — Z0000 Encounter for general adult medical examination without abnormal findings: Secondary | ICD-10-CM | POA: Diagnosis not present

## 2017-11-24 DIAGNOSIS — G8929 Other chronic pain: Secondary | ICD-10-CM | POA: Diagnosis not present

## 2017-11-24 DIAGNOSIS — Z125 Encounter for screening for malignant neoplasm of prostate: Secondary | ICD-10-CM | POA: Diagnosis not present

## 2017-11-24 DIAGNOSIS — K649 Unspecified hemorrhoids: Secondary | ICD-10-CM | POA: Diagnosis not present

## 2017-11-24 DIAGNOSIS — Z1211 Encounter for screening for malignant neoplasm of colon: Secondary | ICD-10-CM | POA: Diagnosis not present

## 2017-11-24 DIAGNOSIS — E119 Type 2 diabetes mellitus without complications: Secondary | ICD-10-CM | POA: Diagnosis not present

## 2017-11-24 MED FILL — HYDROCORT-PRAMOXINE 2.5%-1%: 2.5-1 | 30 days supply | Qty: 360 | Fill #0

## 2017-11-27 MED FILL — diazePAM 5 MG TABS: 5 | 30 days supply | Qty: 120 | Fill #0

## 2017-12-22 IMAGING — US US ART/VEN ABD/PELV/SCROTUM DOPPLER LTD
1 series · 14 of 25 positions shown · non-contrast
Comparison: None.

CLINICAL DATA: Left testicular pain

EXAM:
SCROTAL ULTRASOUND
DOPPLER ULTRASOUND OF THE TESTICLES
TECHNIQUE: Complete ultrasound examination of the testicles, epididymis, and
other scrotal structures was performed. Color and spectral Doppler
ultrasound were also utilized to evaluate blood flow to the
testicles.

[Series 1: us art/ven abd/pelv/scrotum doppler ltd · 0.07mm/px · 14 of 67 slices shown]
[im 1/67]
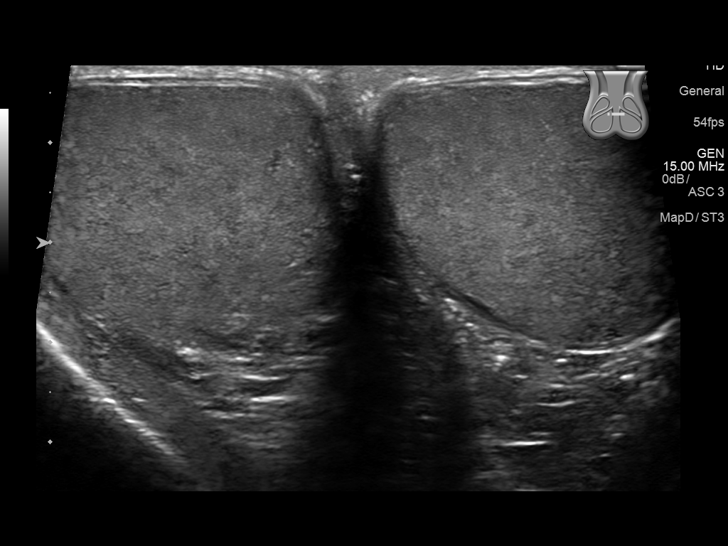
[im 6/67]
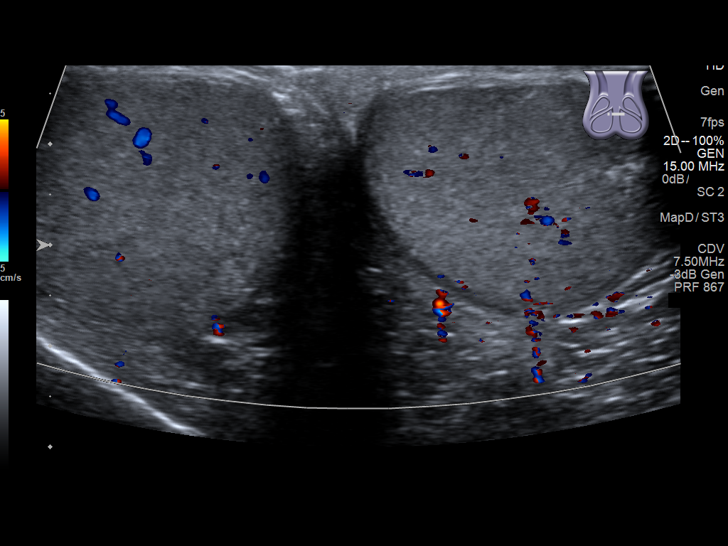
[im 12/67]
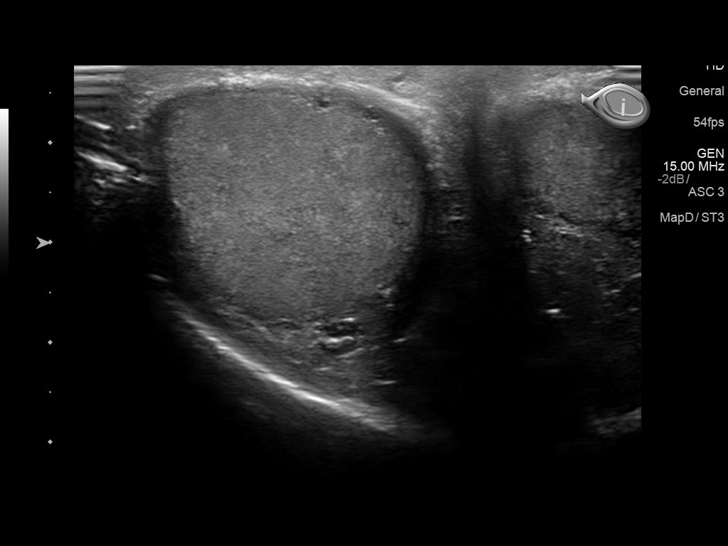
[im 17/67]
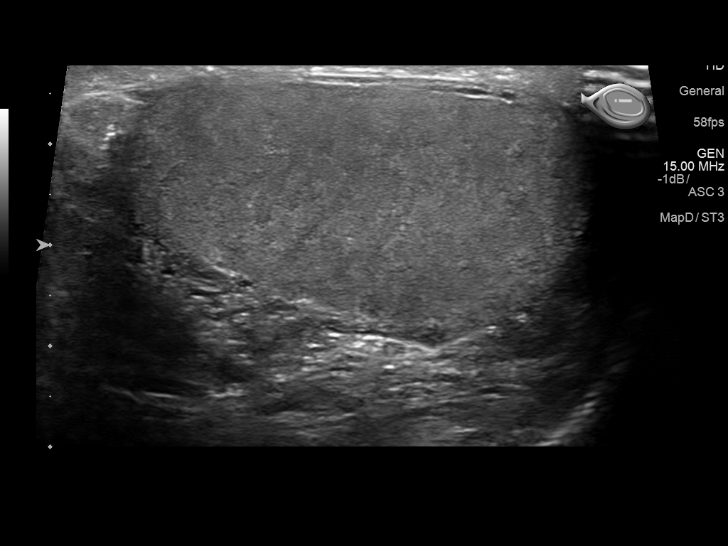
[im 23/67]
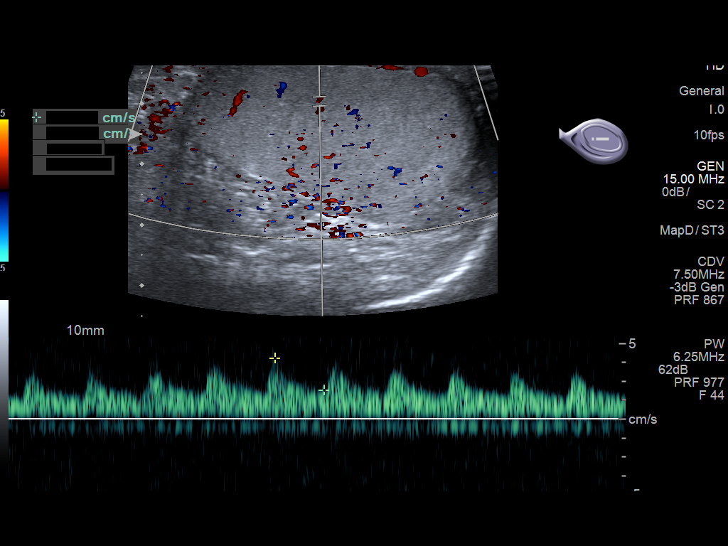
[im 25/67]
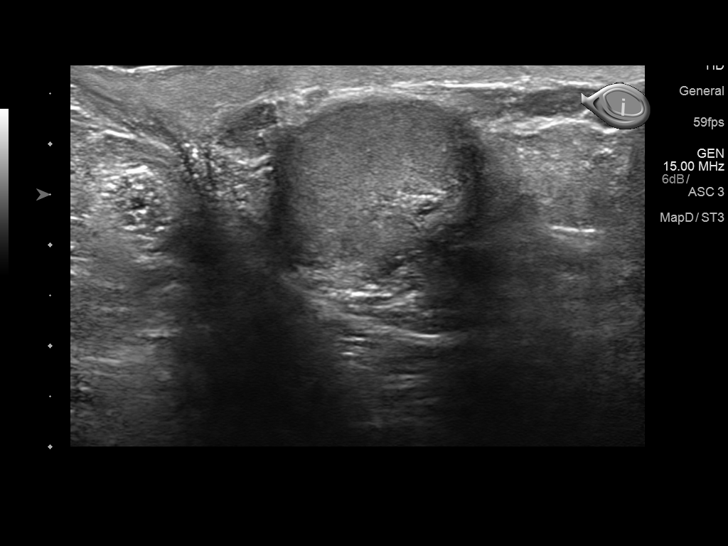
[im 31/67]
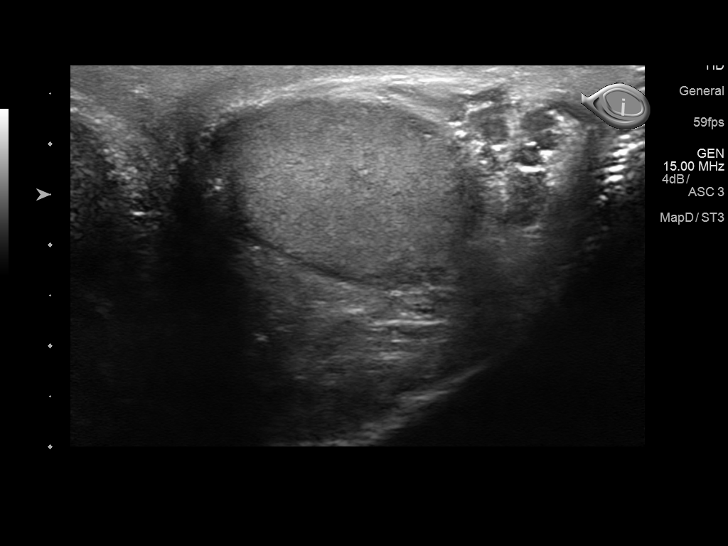
[im 36/67]
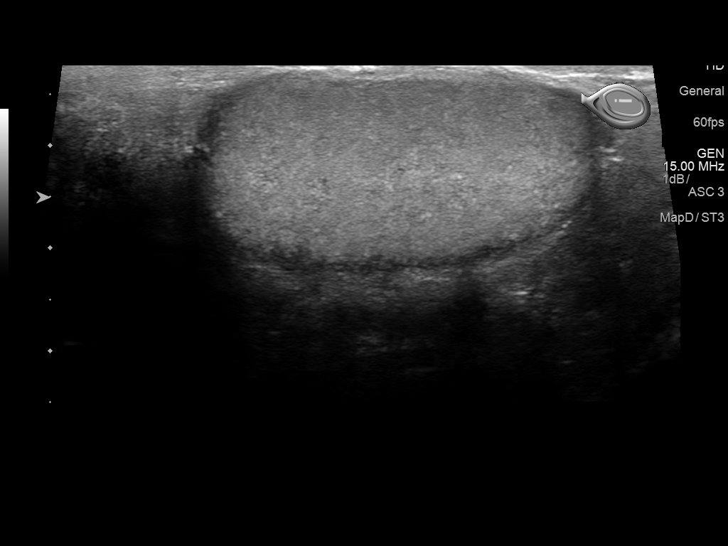
[im 42/67]
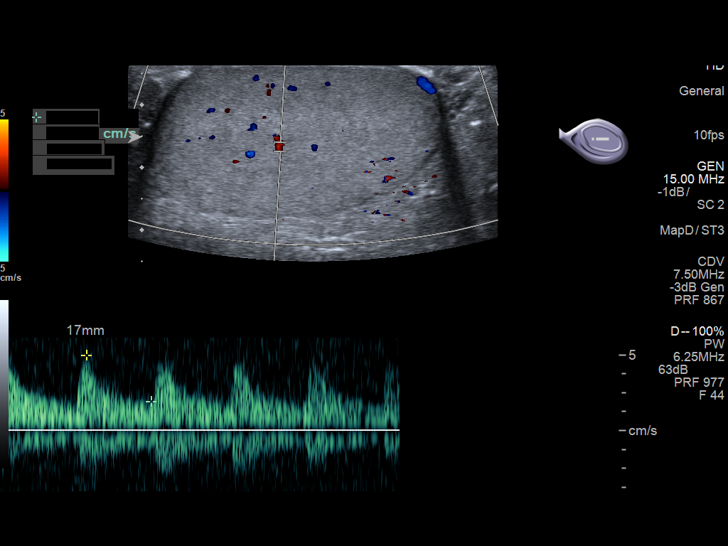
[im 45/67]
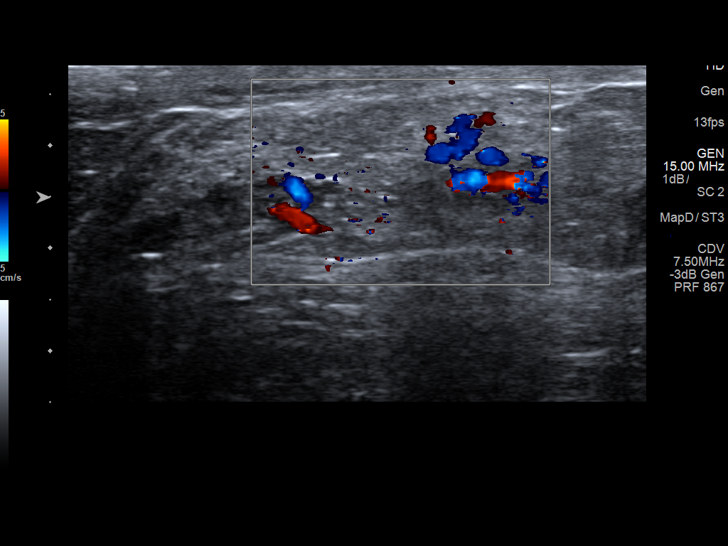
[im 50/67]
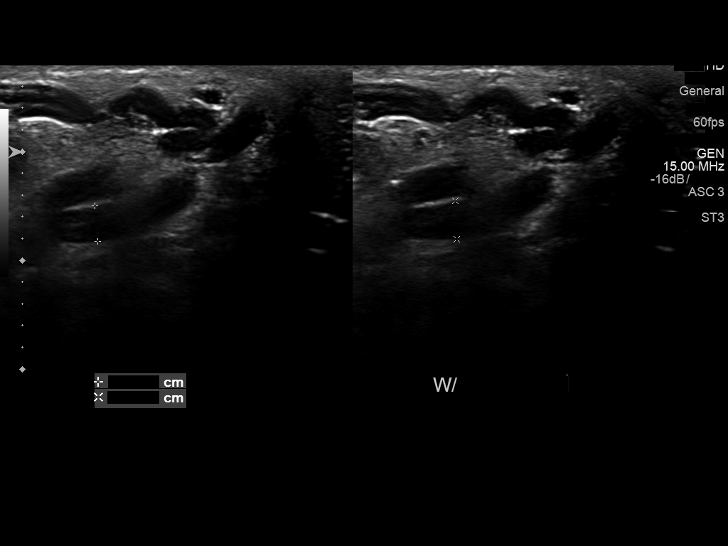
[im 56/67]
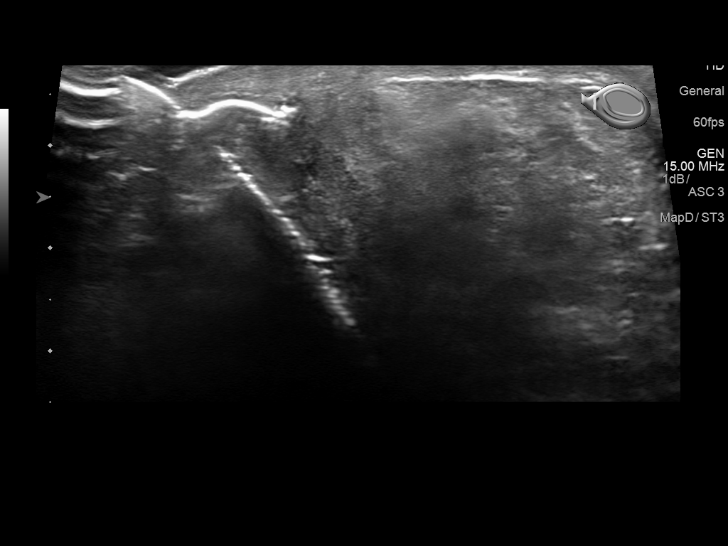
[im 61/67]
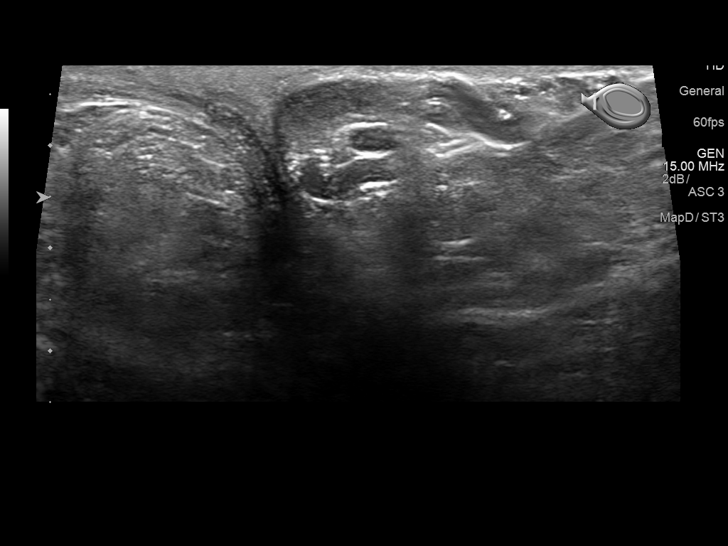
[im 67/67]
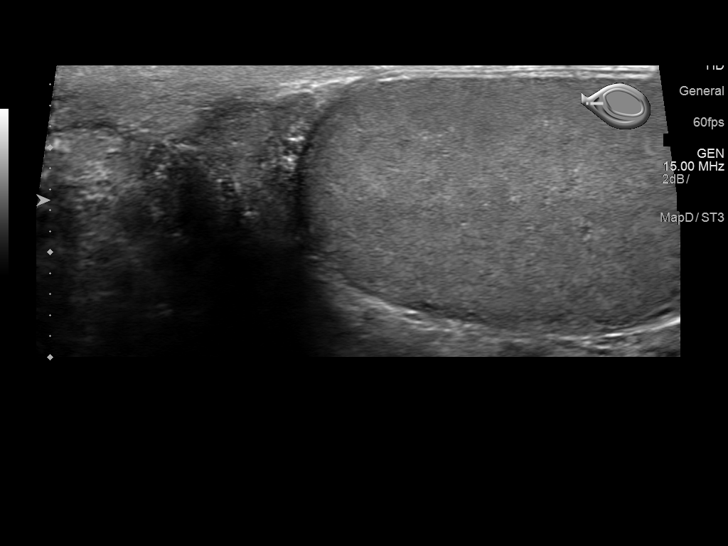

[14 of 25 positions shown; findings below may reference images not displayed]

FINDINGS: Right testicle

Measurements: 5.3 x 2.5 x 3.0 cm. No mass or microlithiasis
visualized.

Left testicle

Measurements: 4.9 x 2.7 x 3.2 cm. No mass or microlithiasis
visualized.

Right epididymis:  Normal in size and appearance.

Left epididymis:  Normal in size and appearance.

Hydrocele:  None visualized.

Varicocele: Mildly prominent vessels on the left, but these are
upper limits normal in greatest diameter 3 mm and do not change with
Valsalva, therefore felt to be within normal limits.

Pulsed Doppler interrogation of both testes demonstrates normal low
resistance arterial and venous waveforms bilaterally.
IMPRESSION: Unremarkable scrotal ultrasound.

## 2018-01-07 DIAGNOSIS — M545 Low back pain: Secondary | ICD-10-CM | POA: Diagnosis not present

## 2018-01-07 DIAGNOSIS — Z79899 Other long term (current) drug therapy: Secondary | ICD-10-CM | POA: Diagnosis not present

## 2018-01-07 DIAGNOSIS — J069 Acute upper respiratory infection, unspecified: Secondary | ICD-10-CM | POA: Diagnosis not present

## 2018-01-07 DIAGNOSIS — I10 Essential (primary) hypertension: Secondary | ICD-10-CM | POA: Diagnosis not present

## 2018-01-07 DIAGNOSIS — E119 Type 2 diabetes mellitus without complications: Secondary | ICD-10-CM | POA: Diagnosis not present

## 2018-01-07 MED FILL — AZITHROMYCIN 500 MG TABLET: 500 | 4 days supply | Qty: 4 | Fill #0

## 2018-01-07 MED FILL — HYDROCODONE-CHLORPHEN ER SU: 10-8 | 10 days supply | Qty: 100 | Fill #0

## 2018-01-07 MED FILL — diazePAM 5 MG TABS: 5 | 90 days supply | Qty: 180 | Fill #0

## 2018-01-13 DIAGNOSIS — E669 Obesity, unspecified: Secondary | ICD-10-CM | POA: Diagnosis not present

## 2018-01-13 DIAGNOSIS — K219 Gastro-esophageal reflux disease without esophagitis: Secondary | ICD-10-CM | POA: Diagnosis not present

## 2018-01-13 DIAGNOSIS — K625 Hemorrhage of anus and rectum: Secondary | ICD-10-CM | POA: Diagnosis not present

## 2018-01-13 DIAGNOSIS — Z1211 Encounter for screening for malignant neoplasm of colon: Secondary | ICD-10-CM | POA: Diagnosis not present

## 2018-01-13 MED FILL — PEG 3350/ELECTROLYTE SOLN: 240 | 1 days supply | Qty: 4000 | Fill #0

## 2018-01-19 MED FILL — metFORMIN HCL 500 MG TABS: 500 | 60 days supply | Qty: 60 | Fill #4

## 2018-01-22 MED FILL — LANSOPRAZOLE 30 MG CPDR: 30 | 90 days supply | Qty: 90 | Fill #0

## 2018-01-22 MED FILL — SILDENAFIL CITRATE 100 MG T: 100 | 30 days supply | Qty: 6 | Fill #0

## 2018-02-09 DIAGNOSIS — L218 Other seborrheic dermatitis: Secondary | ICD-10-CM | POA: Diagnosis not present

## 2018-02-09 MED FILL — HYDROCORTISONE 2.5% CREAM: 2.5 | 20 days supply | Qty: 30 | Fill #0

## 2018-02-09 MED FILL — FLUOCINONIDE 0.05 % SOLN: 0.05 | 20 days supply | Qty: 60 | Fill #0

## 2018-03-03 DIAGNOSIS — L218 Other seborrheic dermatitis: Secondary | ICD-10-CM | POA: Diagnosis not present

## 2018-03-03 MED FILL — KETOCONAZOLE 2% CREAM: 2 | 30 days supply | Qty: 60 | Fill #0

## 2018-03-31 MED FILL — SILDENAFIL CITRATE 100 MG T: 100 | 30 days supply | Qty: 6 | Fill #1

## 2018-03-31 MED FILL — FLUOCINONIDE 0.05 % SOLN: 0.05 | 20 days supply | Qty: 60 | Fill #1

## 2018-05-05 MED FILL — LANSOPRAZOLE 30 MG CPDR: 30 | 90 days supply | Qty: 90 | Fill #0

## 2018-06-02 IMAGING — CT CT CHEST W/ CM
2 of 4 series · 15 of 36 positions shown, 18 images · IV contrast (agent unspecified)
Comparison: CT 09/12/2016

CLINICAL DATA: Pneumonia.  Follow-up.

EXAM:
CT CHEST WITH CONTRAST
TECHNIQUE: Multidetector CT imaging of the chest was performed during
intravenous contrast administration.
CONTRAST:  75 cc Zsovue-1XX IV

[Series 2: axial st · axial · 0.81mm/px · z∈[-356,-100]mm · 12 of 152 slices shown, 15 images]
[im 12/152  mediastinal]
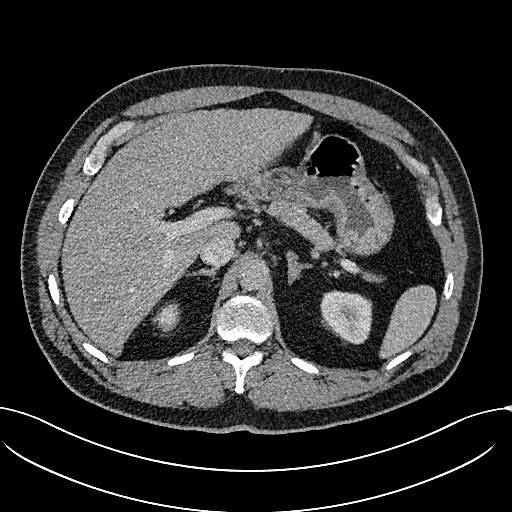
[im 12/152  lung]
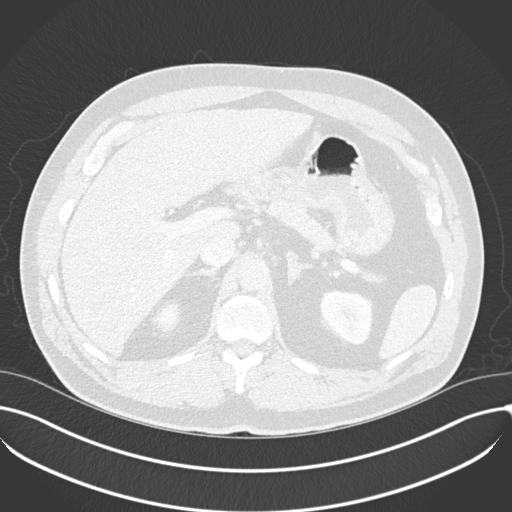
[im 24/152  lung]
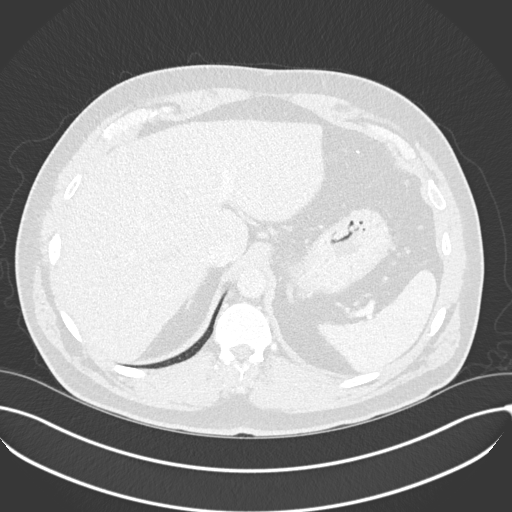
[im 35/152  lung]
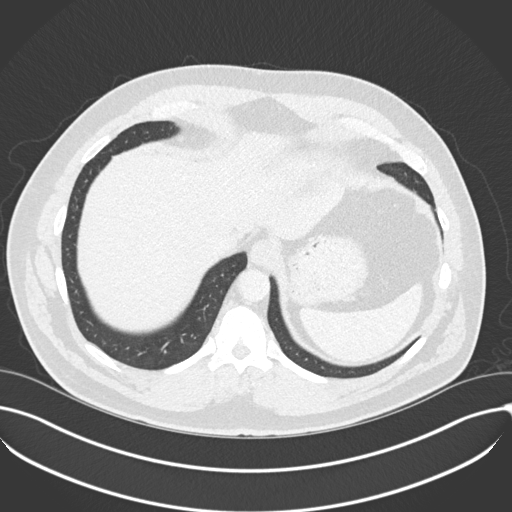
[im 47/152  lung]
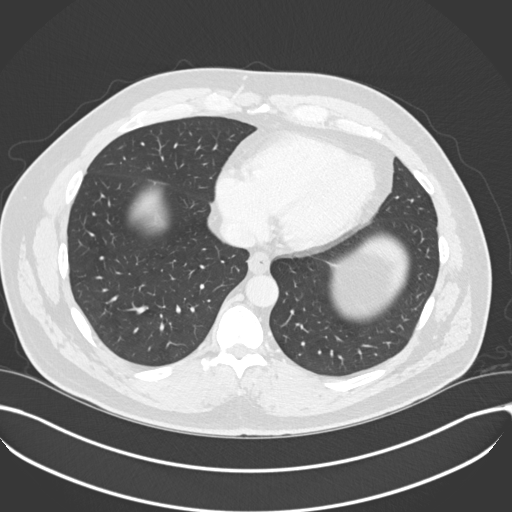
[im 59/152  mediastinal]
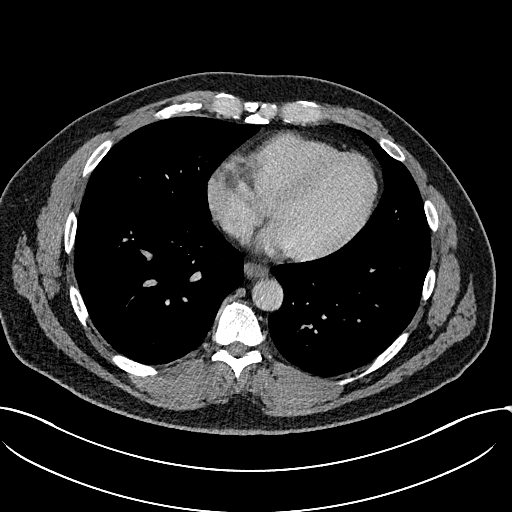
[im 59/152  lung]
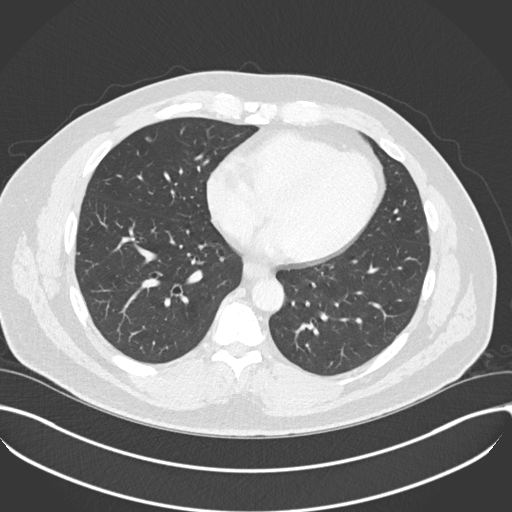
[im 70/152  lung]
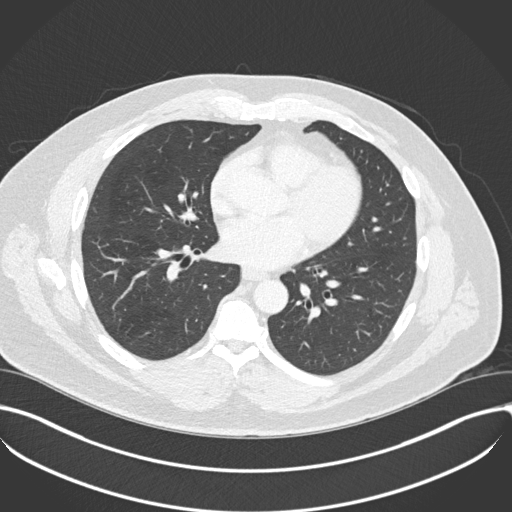
[im 82/152  lung]
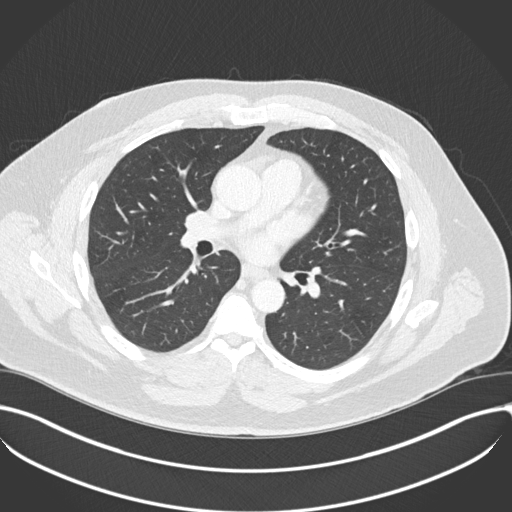
[im 93/152  lung]
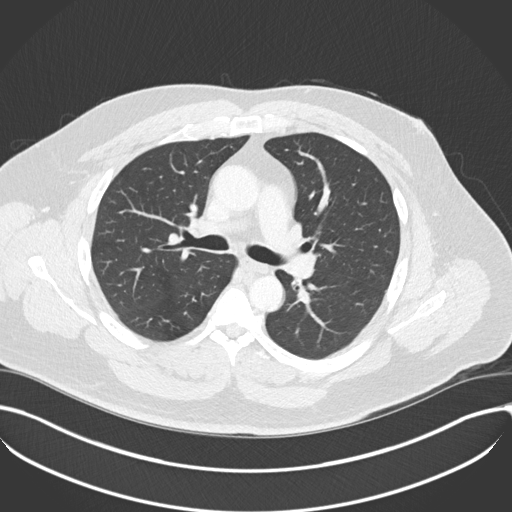
[im 105/152  mediastinal]
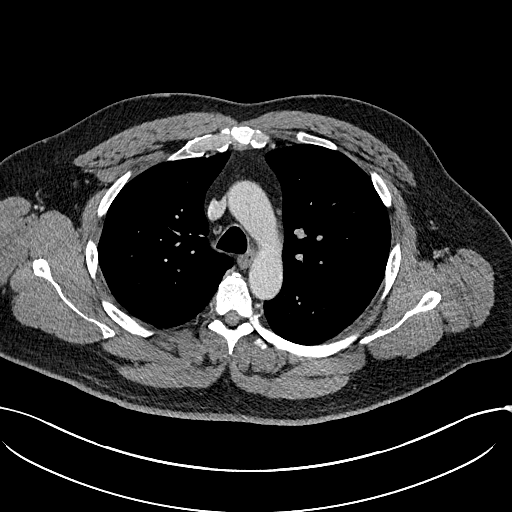
[im 105/152  lung]
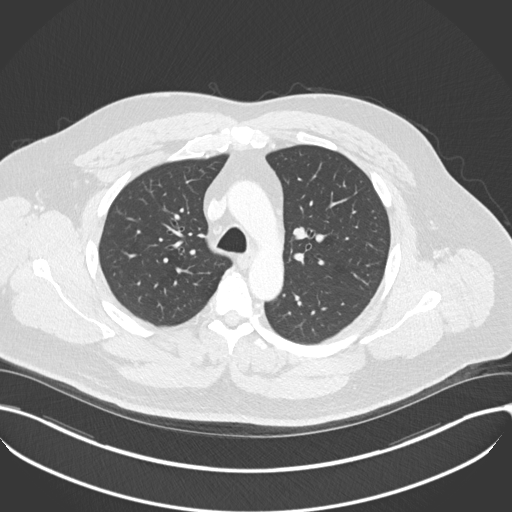
[im 117/152  lung]
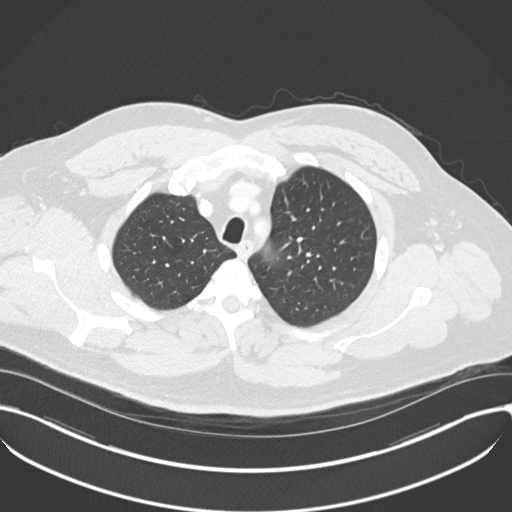
[im 128/152  lung]
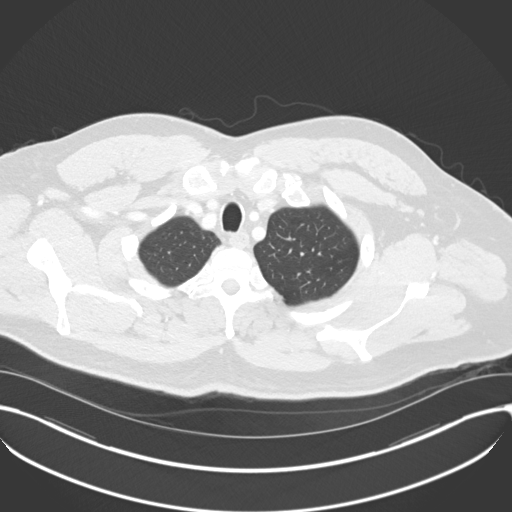
[im 140/152  lung]
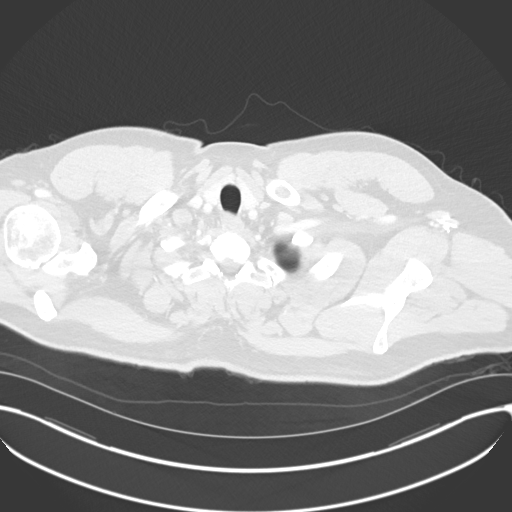

[Series 6: coronal · coronal · 0.61mm/px · 3 of 149 slices shown]
[im 30/149  lung]
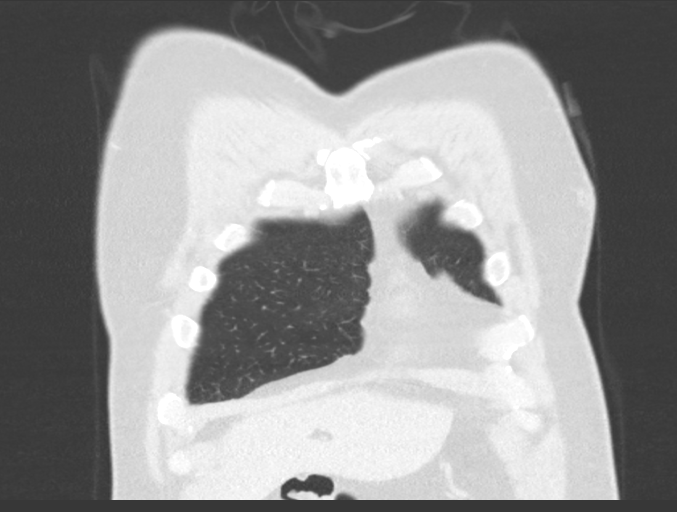
[im 60/149  lung]
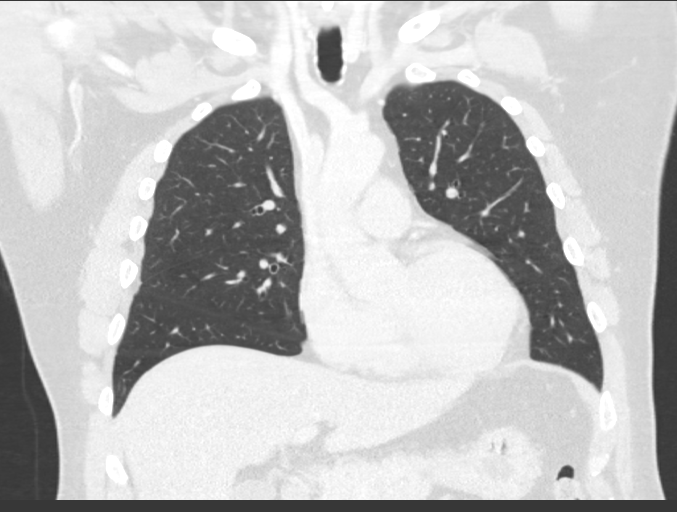
[im 89/149  lung]
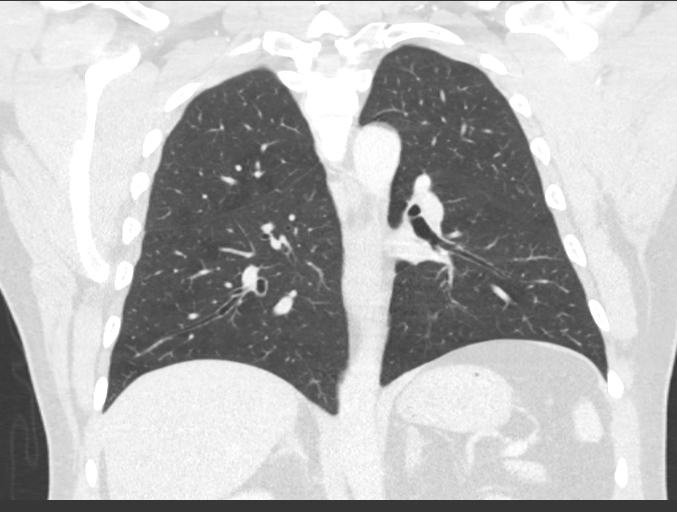

[15 of 36 positions shown; findings below may reference images not displayed]

FINDINGS: Cardiovascular: Heart is normal size. Aorta is normal caliber.

Mediastinum/Nodes: No mediastinal, hilar, or axillary adenopathy.
Trachea and esophagus are unremarkable.

Lungs/Pleura: Previously seen patchy ground-glass airspace disease
in the right upper lobe has resolved. No confluent opacities or
effusions.

Upper Abdomen: Imaging into the upper abdomen shows no acute
findings.

Musculoskeletal: Chest wall soft tissues are unremarkable.No acute
bony abnormality.
IMPRESSION: No acute cardiopulmonary disease. Resolution of the previously seen
bronchopneumonia in the right upper lobe.

## 2018-06-23 MED FILL — SILDENAFIL CITRATE 100 MG T: 100 | 30 days supply | Qty: 6 | Fill #2

## 2018-06-23 MED FILL — metFORMIN HCL 500 MG TABS: 500 | 30 days supply | Qty: 30 | Fill #0

## 2018-09-18 MED FILL — SILDENAFIL CITRATE 100 MG T: 100 | 90 days supply | Qty: 18 | Fill #3

## 2018-09-18 MED FILL — metFORMIN HCL 500 MG TABS: 500 | 90 days supply | Qty: 90 | Fill #1

## 2018-09-18 MED FILL — LISINOPRIL 20 MG TABLET: 20 | 90 days supply | Qty: 180 | Fill #0

## 2018-09-21 ENCOUNTER — Encounter (HOSPITAL_COMMUNITY): Payer: Self-pay | Admitting: *Deleted

## 2018-09-21 ENCOUNTER — Telehealth (HOSPITAL_COMMUNITY): Payer: Self-pay | Admitting: *Deleted

## 2018-09-21 DIAGNOSIS — R079 Chest pain, unspecified: Secondary | ICD-10-CM | POA: Diagnosis not present

## 2018-09-21 DIAGNOSIS — R0602 Shortness of breath: Secondary | ICD-10-CM | POA: Diagnosis not present

## 2018-09-21 DIAGNOSIS — E119 Type 2 diabetes mellitus without complications: Secondary | ICD-10-CM | POA: Diagnosis not present

## 2018-09-21 DIAGNOSIS — R0600 Dyspnea, unspecified: Secondary | ICD-10-CM | POA: Diagnosis not present

## 2018-09-21 DIAGNOSIS — Z0000 Encounter for general adult medical examination without abnormal findings: Secondary | ICD-10-CM | POA: Diagnosis not present

## 2018-09-21 NOTE — Telephone Encounter (Signed)
Attempted to call patient regarding upcoming appointment- no answer, unable to leave a message.  Gary Ramos Jacqueline  

## 2018-09-22 ENCOUNTER — Other Ambulatory Visit (HOSPITAL_COMMUNITY): Payer: Self-pay | Admitting: Internal Medicine

## 2018-09-22 DIAGNOSIS — R079 Chest pain, unspecified: Secondary | ICD-10-CM

## 2018-09-22 DIAGNOSIS — R0609 Other forms of dyspnea: Secondary | ICD-10-CM

## 2018-09-23 ENCOUNTER — Encounter (HOSPITAL_COMMUNITY): Payer: Self-pay

## 2018-09-23 ENCOUNTER — Other Ambulatory Visit: Payer: Self-pay

## 2018-09-23 ENCOUNTER — Ambulatory Visit (HOSPITAL_COMMUNITY): Payer: 59 | Attending: Cardiology

## 2018-09-23 DIAGNOSIS — R079 Chest pain, unspecified: Secondary | ICD-10-CM | POA: Insufficient documentation

## 2018-09-23 DIAGNOSIS — R0609 Other forms of dyspnea: Secondary | ICD-10-CM | POA: Diagnosis not present

## 2018-09-23 LAB — MYOCARDIAL PERFUSION IMAGING
LV dias vol: 97 mL (ref 62–150)
LV sys vol: 35 mL
Peak HR: 101 {beats}/min
Rest HR: 60 {beats}/min
SDS: 0
SRS: 0
SSS: 0
TID: 1.11

## 2018-09-23 MED ORDER — TECHNETIUM TC 99M TETROFOSMIN IV KIT
10.6000 | PACK | Freq: Once | INTRAVENOUS | Status: AC | PRN
  Administered 2018-09-23: 10.6 via INTRAVENOUS
  Filled 2018-09-23: qty 11

## 2018-09-23 MED ORDER — TECHNETIUM TC 99M TETROFOSMIN IV KIT
32.6000 | PACK | Freq: Once | INTRAVENOUS | Status: AC | PRN
  Administered 2018-09-23: 32.6 via INTRAVENOUS
  Filled 2018-09-23: qty 33

## 2018-09-23 MED ORDER — REGADENOSON 0.4 MG/5ML IV SOLN
0.4000 mg | Freq: Once | INTRAVENOUS | Status: AC
  Administered 2018-09-23: 09:00:00 0.4 mg via INTRAVENOUS

## 2018-09-23 MED FILL — SIMVASTATIN 20 MG TABLET: 20 | 90 days supply | Qty: 90 | Fill #0

## 2018-09-25 ENCOUNTER — Ambulatory Visit: Payer: 59 | Admitting: Physician Assistant

## 2018-09-25 ENCOUNTER — Telehealth: Payer: Self-pay | Admitting: *Deleted

## 2018-09-25 NOTE — Telephone Encounter (Signed)
Called pt to offer in-office appt 09/28/2018.  Pt declined and would like to keep virtual and gave verbal consent.     Virtual Visit Pre-Appointment Phone Call  "(Name), I am calling you today to discuss your upcoming appointment. We are currently trying to limit exposure to the virus that causes COVID-19 by seeing patients at home rather than in the office."  1. "What is the BEST phone number to call the day of the visit?" - include this in appointment notes  2. "Do you have or have access to (through a family member/friend) a smartphone with video capability that we can use for your visit?" a. If yes - list this number in appt notes as "cell" (if different from BEST phone #) and list the appointment type as a VIDEO visit in appointment notes b. If no - list the appointment type as a PHONE visit in appointment notes  3. Confirm consent - "In the setting of the current Covid19 crisis, you are scheduled for a (phone or video) visit with your provider on (date) at (time).  Just as we do with many in-office visits, in order for you to participate in this visit, we must obtain consent.  If you'd like, I can send this to your mychart (if signed up) or email for you to review.  Otherwise, I can obtain your verbal consent now.  All virtual visits are billed to your insurance company just like a normal visit would be.  By agreeing to a virtual visit, we'd like you to understand that the technology does not allow for your provider to perform an examination, and thus may limit your provider's ability to fully assess your condition. If your provider identifies any concerns that need to be evaluated in person, we will make arrangements to do so.  Finally, though the technology is pretty good, we cannot assure that it will always work on either your or our end, and in the setting of a video visit, we may have to convert it to a phone-only visit.  In either situation, we cannot ensure that we have a secure  connection.  Are you willing to proceed?" STAFF: Did the patient verbally acknowledge consent to telehealth visit? Document YES/NO here: YES  4. Advise patient to be prepared - "Two hours prior to your appointment, go ahead and check your blood pressure, pulse, oxygen saturation, and your weight (if you have the equipment to check those) and write them all down. When your visit starts, your provider will ask you for this information. If you have an Apple Watch or Kardia device, please plan to have heart rate information ready on the day of your appointment. Please have a pen and paper handy nearby the day of the visit as well."  5. Give patient instructions for MyChart download to smartphone OR Doximity/Doxy.me as below if video visit (depending on what platform provider is using)  6. Inform patient they will receive a phone call 15 minutes prior to their appointment time (may be from unknown caller ID) so they should be prepared to answer    TELEPHONE CALL NOTE  JEFFEY JANSSEN has been deemed a candidate for a follow-up tele-health visit to limit community exposure during the Covid-19 pandemic. I spoke with the patient via phone to ensure availability of phone/video source, confirm preferred email & phone number, and discuss instructions and expectations.  I reminded KOLBEE BOGUSZ to be prepared with any vital sign and/or heart rhythm information that could potentially be  obtained via home monitoring, at the time of his visit. I reminded Duncan DullJoseph A Gaddie to expect a phone call prior to his visit.  Elliot CousinWitty, Dellar Traber, RMA 09/25/2018 11:18 AM   INSTRUCTIONS FOR DOWNLOADING THE MYCHART APP TO SMARTPHONE  - The patient must first make sure to have activated MyChart and know their login information - If Apple, go to [employer] Store and type in MyChart in the search bar and download the app. If Android, ask patient to go to [employer] Play Store and type in [place] MissionMyChart in the search bar and download the app. The app  is free but as with any other app downloads, their phone may require them to verify saved payment information or Apple/Android password.  - The patient will need to then log into the app with their MyChart username and password, and select [facility] as their healthcare provider to link the account. When it is time for your visit, go to the MyChart app, find appointments, and click Begin Video Visit. Be sure to Select Allow for your device to access the Microphone and Camera for your visit. You will then be connected, and your provider will be with you shortly.  **If they have any issues connecting, or need assistance please contact MyChart service desk [phone])**  **If using a computer, in order to ensure the best quality for their visit they will need to use either of the following Internet Browsers: [employer] Edge, or Google Chrome**  IF USING DOXIMITY or DOXY.ME - The patient will receive a link just prior to their visit by text.     FULL LENGTH CONSENT FOR TELE-HEALTH VISIT   I hereby voluntarily request, consent and authorize [facility] and its employed or contracted physicians, physician assistants, nurse practitioners or other licensed health care professionals (the Practitioner), to provide me with telemedicine health care services (the "Services") as deemed necessary by the treating Practitioner. I acknowledge and consent to receive the Services by the Practitioner via telemedicine. I understand that the telemedicine visit will involve communicating with the Practitioner through live audiovisual communication technology and the disclosure of certain medical information by electronic transmission. I acknowledge that I have been given the opportunity to request an in-person assessment or other available alternative prior to the telemedicine visit and am voluntarily participating in the telemedicine visit.  I understand that I have the right to withhold or withdraw my  consent to the use of telemedicine in the course of my care at any time, without affecting my right to future care or treatment, and that the Practitioner or I may terminate the telemedicine visit at any time. I understand that I have the right to inspect all information obtained and/or recorded in the course of the telemedicine visit and may receive copies of available information for a reasonable fee.  I understand that some of the potential risks of receiving the Services via telemedicine include:  Marland Kitchen. Delay or interruption in medical evaluation due to technological equipment failure or disruption; . Information transmitted may not be sufficient (e.g. poor resolution of images) to allow for appropriate medical decision making by the Practitioner; and/or  . In rare instances, security protocols could fail, causing a breach of personal health information.  Furthermore, I acknowledge that it is my responsibility to provide information about my medical history, conditions and care that is complete and accurate to the best of my ability. I acknowledge that Practitioner's advice, recommendations, and/or decision may be based on factors not within their control, such  as incomplete or inaccurate data provided by me or distortions of diagnostic images or specimens that may result from electronic transmissions. I understand that the practice of medicine is not an exact science and that Practitioner makes no warranties or guarantees regarding treatment outcomes. I acknowledge that I will receive a copy of this consent concurrently upon execution via email to the email address I last provided but may also request a printed copy by calling the office of [place].    I understand that my insurance will be billed for this visit.   I have read or had this consent read to me. . I understand the contents of this consent, which adequately explains the benefits and risks of the Services being provided via telemedicine.   . I have been provided ample opportunity to ask questions regarding this consent and the Services and have had my questions answered to my satisfaction. . I give my informed consent for the services to be provided through the use of telemedicine in my medical care  By participating in this telemedicine visit I agree to the above.

## 2018-09-26 NOTE — Progress Notes (Signed)
Virtual Visit via Video Note   This visit type was conducted due to national recommendations for restrictions regarding the COVID-19 Pandemic (e.g. social distancing) in an effort to limit this patient's exposure and mitigate transmission in our community.  Due to his co-morbid illnesses, this patient is at least at moderate risk for complications without adequate follow up.  This format is felt to be most appropriate for this patient at this time.  All issues noted in this document were discussed and addressed.  A limited physical exam was performed with this format.  Please refer to the patient's chart for his consent to telehealth for [facility].   Date:  09/28/2018   ID:  Gary Ramos, DOB [birth year], MRN [id number]  Patient Location: Home Provider Location: Office  PCP:  Jani Gravel, MD  Cardiologist:  Dr. Burt Knack  Evaluation Performed:  Follow-Up Visit  Chief Complaint:  2 year follow up/dyspnea  History of Present Illness:    Gary Ramos is a 53 y.o. male with with hx of HTN, DM  and OSA not on CPAP seen for dyspnea.   He was seen by Dr. Burt Knack once 11/2016 for dyspnea after being admitted for CAP. His resolved with weight loss and treatment of pneumonia. No work up recommended.  His PCP has order stress test recently which was low risk. This was done for DOE and right sided chest pressure. Worse with palpation.  Also noted hypertensive during visit>> increase lisinopril.  Patient reports few months history of shortness of breath and right-sided chest pressure.  This occurs with activity by reading a mask.  Resolved with rest.  No symptoms without mask.  Denies regular exercise.  His symptoms have improved with normalization of his blood pressure, almost gone.  He thinks he has uncontrolled GERD.  He denies palpitation, orthopnea, PND, syncope, lower extremity edema or melena.  The patient does not have symptoms concerning for COVID-19 infection (fever, chills, cough, or  new shortness of breath).    Past Medical History:  Diagnosis Date  . GERD (gastroesophageal reflux disease)   . Hypertension   . Sleep apnea    does not wear cpap   Past Surgical History:  Procedure Laterality Date  . 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 BOWEL RESECTION    . RHINOPLASTY       Current Meds  Medication Sig  . chlorpheniramine-HYDROcodone (TUSSIONEX) 10-8 MG/5ML SUER Take 5 mLs by mouth as needed.   . diazepam (VALIUM) 5 MG tablet Take 5 mg every 6 (six) hours as needed by mouth for muscle spasms.   . lansoprazole (PREVACID) 30 MG capsule Take 30 mg by mouth daily.   Marland Kitchen lisinopril (ZESTRIL) 20 MG tablet Take 20 mg by mouth 2 (two) times daily.  . metFORMIN (GLUCOPHAGE) 500 MG tablet Take 500 mg by mouth 2 (two) times daily.   . sildenafil (VIAGRA) 100 MG tablet Take 1 tablet as directed by mouth.     Allergies:   Patient has no known allergies.   Social History   Tobacco Use  . Smoking status: Never Smoker  . Smokeless tobacco: Never Used  Substance Use Topics  . Alcohol use: No  . Drug use: No     Family Hx: The patient's family history includes Cancer - Lung in his paternal grandfather; Diabetes  in his mother; Healthy in his father, sister, sister, sister, and sister; Other in his maternal grandmother; Renal Disease in his mother.  ROS:   Please see the history of present illness.   All other systems reviewed and are negative.   Prior CV studies:   The following studies were reviewed today:  Stress test 09/23/2018  Nuclear stress EF: 64%.  T wave inversion was noted during stress in the III leads, and returning to baseline after 1-5 mins of recovery.  There was no ST segment deviation noted during stress.  The study is normal.  This is a low risk  study.  The left ventricular ejection fraction is normal (55-65%).  Labs/Other Tests and Data Reviewed:    EKG:  No ECG reviewed.  Recent Labs: No results found for requested labs within last 8760 hours.   Recent Lipid Panel No results found for: CHOL, TRIG, HDL, CHOLHDL, LDLCALC, LDLDIRECT  Wt Readings from Last 3 Encounters:  09/28/18 224 lb (101.6 kg)  09/23/18 225 lb (102.1 kg)  12/12/16 225 lb 12.8 oz (102.4 kg)     Objective:    Vital Signs:  BP 130/71 (BP Location: Right Arm)   Pulse (!) 58   Resp 16   Ht 5\' 9"  (1.753 m)   Wt 224 lb (101.6 kg)   SpO2 99%   BMI 33.08 kg/m    VITAL SIGNS:  reviewed GEN:  no acute distress EYES:  sclerae anicteric, EOMI - Extraocular Movements Intact RESPIRATORY:  normal respiratory effort, symmetric expansion CARDIOVASCULAR:  no peripheral edema SKIN:  no rash, lesions or ulcers. MUSCULOSKELETAL:  no obvious deformities. NEURO:  alert and oriented x 3, no obvious focal deficit PSYCH:  normal affect  ASSESSMENT & PLAN:    1. Dyspnea on exertion Improved with better blood pressure control.  No CHF symptoms.  Likely due to deconditioning and wearing mask.  Stress test low risk.  Encourage daily exercise and weight loss regimen.  2.  Chest pain -Seems atypical.  Stress test without evidence of ischemia.  Could be due to untreated acid reflux.  Advised trial of over-the-counter PPI.  3.  Hypertension -Stable and well-controlled on current regimen.  4.  Obstructive sleep apnea -Noncompliant with CPAP  COVID-19 Education: The signs and symptoms of COVID-19 were discussed with the patient and how to seek care for testing (follow up with PCP or arrange E-visit).  The importance of social distancing was discussed today.  Time:   Today, I have spent 10 minutes with the patient with telehealth technology discussing the above problems.     Medication Adjustments/Labs and Tests Ordered: Current medicines are reviewed at length  with the patient today.  Concerns regarding medicines are outlined above.   Tests Ordered: No orders of the defined types were placed in this encounter.   Medication Changes: No orders of the defined types were placed in this encounter.   Follow Up:  Virtual Visit or In Person prn  Lorelei PontSigned, Briggitte Boline, PA  09/28/2018 10:04 AM    [facility]

## 2018-09-28 ENCOUNTER — Telehealth (INDEPENDENT_AMBULATORY_CARE_PROVIDER_SITE_OTHER): Payer: 59 | Admitting: Physician Assistant

## 2018-09-28 ENCOUNTER — Other Ambulatory Visit: Payer: Self-pay

## 2018-09-28 ENCOUNTER — Encounter: Payer: Self-pay | Admitting: Physician Assistant

## 2018-09-28 VITALS — BP 130/71 | HR 58 | Resp 16 | Ht 69.0 in | Wt 224.0 lb

## 2018-09-28 DIAGNOSIS — I10 Essential (primary) hypertension: Secondary | ICD-10-CM

## 2018-09-28 DIAGNOSIS — R0602 Shortness of breath: Secondary | ICD-10-CM

## 2018-09-28 DIAGNOSIS — R079 Chest pain, unspecified: Secondary | ICD-10-CM

## 2018-09-28 DIAGNOSIS — Z7189 Other specified counseling: Secondary | ICD-10-CM

## 2018-09-28 NOTE — Patient Instructions (Signed)
Medication Instructions:  Your physician recommends that you continue on your current medications as directed. Please refer to the Current Medication list given to you today.  If you need a refill on your cardiac medications before your next appointment, please call your pharmacy.   Lab work: None ordered  If you have labs (blood work) drawn today and your tests are completely normal, you will receive your results only by: . MyChart Message (if you have MyChart) OR . A paper copy in the mail If you have any lab test that is abnormal or we need to change your treatment, we will call you to review the results.  Testing/Procedures: None ordered  Follow-Up: At [facility], you and your health needs are our priority.  As part of our continuing mission to provide you with exceptional heart care, we have created designated Provider Care Teams.  These Care Teams include your primary Cardiologist (physician) and Advanced Practice Providers (APPs -  Physician Assistants and Nurse Practitioners) who all work together to provide you with the care you need, when you need it. . AS NEEDED  Any Other Special Instructions Will Be Listed Below (If Applicable).    

## 2018-10-09 ENCOUNTER — Other Ambulatory Visit: Payer: Self-pay | Admitting: Internal Medicine

## 2018-10-09 DIAGNOSIS — R945 Abnormal results of liver function studies: Secondary | ICD-10-CM

## 2018-11-26 DIAGNOSIS — K219 Gastro-esophageal reflux disease without esophagitis: Secondary | ICD-10-CM | POA: Diagnosis not present

## 2018-11-26 DIAGNOSIS — Z0000 Encounter for general adult medical examination without abnormal findings: Secondary | ICD-10-CM | POA: Diagnosis not present

## 2018-11-26 DIAGNOSIS — E119 Type 2 diabetes mellitus without complications: Secondary | ICD-10-CM | POA: Diagnosis not present

## 2018-11-26 DIAGNOSIS — I10 Essential (primary) hypertension: Secondary | ICD-10-CM | POA: Diagnosis not present

## 2018-11-26 DIAGNOSIS — B354 Tinea corporis: Secondary | ICD-10-CM | POA: Diagnosis not present

## 2018-11-26 DIAGNOSIS — E785 Hyperlipidemia, unspecified: Secondary | ICD-10-CM | POA: Diagnosis not present

## 2018-11-26 DIAGNOSIS — F419 Anxiety disorder, unspecified: Secondary | ICD-10-CM | POA: Diagnosis not present

## 2018-11-26 MED FILL — LANSOPRAZOLE 30 MG CPDR: 30 | 90 days supply | Qty: 90 | Fill #0

## 2018-11-26 MED FILL — FLUCONAZOLE 150 MG TABS: 150 | 3 days supply | Qty: 3 | Fill #0

## 2018-11-26 MED FILL — SIMVASTATIN 20 MG TABLET: 20 | 90 days supply | Qty: 90 | Fill #0

## 2018-11-26 MED FILL — diazePAM 5 MG TABS: 5 | 30 days supply | Qty: 60 | Fill #0

## 2018-11-29 MED FILL — SILDENAFIL CITRATE 100 MG T: 100 | 90 days supply | Qty: 18 | Fill #0

## 2018-11-29 MED FILL — metFORMIN HCL 500 MG TABS: 500 | 90 days supply | Qty: 180 | Fill #0

## 2018-12-19 MED FILL — HYDROCORTISONE 2.5% CREAM: 2.5 | 20 days supply | Qty: 30 | Fill #1

## 2018-12-19 MED FILL — SILDENAFIL CITRATE 100 MG T: 100 | 90 days supply | Qty: 18 | Fill #4

## 2018-12-19 MED FILL — SIMVASTATIN 20 MG TABLET: 20 | 90 days supply | Qty: 90 | Fill #0

## 2018-12-19 MED FILL — LANSOPRAZOLE 30 MG CPDR: 30 | 90 days supply | Qty: 90 | Fill #1

## 2018-12-19 MED FILL — FLUCONAZOLE 150 MG TABS: 150 | 3 days supply | Qty: 3 | Fill #0

## 2018-12-19 MED FILL — metFORMIN HCL 500 MG TABS: 500 | 90 days supply | Qty: 90 | Fill #2

## 2018-12-19 MED FILL — diazePAM 5 MG TABS: 5 | 30 days supply | Qty: 60 | Fill #0

## 2018-12-19 MED FILL — LISINOPRIL 20 MG TABLET: 20 | 90 days supply | Qty: 180 | Fill #1

## 2018-12-19 MED FILL — HYDROCORT-PRAMOXINE 2.5%-1%: 2.5-1 | 30 days supply | Qty: 360 | Fill #0

## 2019-01-18 MED FILL — diazePAM 5 MG TABS: 5 | 30 days supply | Qty: 60 | Fill #1

## 2019-01-23 ENCOUNTER — Telehealth: Payer: 59 | Admitting: Nurse Practitioner

## 2019-01-23 DIAGNOSIS — H10021 Other mucopurulent conjunctivitis, right eye: Secondary | ICD-10-CM | POA: Diagnosis not present

## 2019-01-23 MED ORDER — POLYMYXIN B-TRIMETHOPRIM 10000-0.1 UNIT/ML-% OP SOLN: 2.0000 [drp] | OPHTHALMIC | 0 refills | Status: DC

## 2019-01-23 NOTE — Progress Notes (Signed)
E-Visit for Pink Eye   We are sorry that you are not feeling well.  Here is how we plan to help!  Based on what you have shared with me it looks like you have conjunctivitis.  Conjunctivitis is a common inflammatory or infectious condition of the eye that is often referred to as "pink eye".  In most cases it is contagious (viral or bacterial). However, not all conjunctivitis requires antibiotics (ex. Allergic).  We have made appropriate suggestions for you based upon your presentation.  I have prescribed Polytrim Ophthalmic drops 1-2 drops 4 times a day times 5 days  Pink eye can be highly contagious.  It is typically spread through direct contact with secretions, or contaminated objects or surfaces that one may have touched.  Strict handwashing is suggested with soap and water is urged.  If not available, use alcohol based had sanitizer.  Avoid unnecessary touching of the eye.  If you wear contact lenses, you will need to refrain from wearing them until you see no white discharge from the eye for at least 24 hours after being on medication.  You should see symptom improvement in 1-2 days after starting the medication regimen.  Call us if symptoms are not improved in 1-2 days.  Home Care:  Wash your hands often!  Do not wear your contacts until you complete your treatment plan.  Avoid sharing towels, bed linen, personal items with a person who has pink eye.  See attention for anyone in your home with similar symptoms.  Get Help Right Away If:  Your symptoms do not improve.  You develop blurred or loss of vision.  Your symptoms worsen (increased discharge, pain or redness)  Your e-visit answers were reviewed by a board certified advanced clinical practitioner to complete your personal care plan.  Depending on the condition, your plan could have included both over the counter or prescription medications.  If there is a problem please reply  once you have received a response from your  provider.  Your safety is important to us.  If you have drug allergies check your prescription carefully.    You can use MyChart to ask questions about today's visit, request a non-urgent call back, or ask for a work or school excuse for 24 hours related to this e-Visit. If it has been greater than 24 hours you will need to follow up with your provider, or enter a new e-Visit to address those concerns.   You will get an e-mail in the next two days asking about your experience.  I hope that your e-visit has been valuable and will speed your recovery. Thank you for using e-visits.   5-10 minutes spent reviewing and documenting in chart.    

## 2019-03-22 MED FILL — LISINOPRIL 20 MG TABLET: 20 | 90 days supply | Qty: 180 | Fill #2

## 2019-03-22 MED FILL — SILDENAFIL CITRATE 100 MG T: 100 | 90 days supply | Qty: 18 | Fill #0

## 2019-03-22 MED FILL — SIMVASTATIN 20 MG TABLET: 20 | 90 days supply | Qty: 90 | Fill #1

## 2019-03-22 MED FILL — metFORMIN HCL 500 MG TABS: 500 | 90 days supply | Qty: 90 | Fill #3

## 2019-03-22 MED FILL — diazePAM 5 MG TABS: 5 | 30 days supply | Qty: 60 | Fill #0

## 2019-03-22 MED FILL — LANSOPRAZOLE 30 MG CPDR: 30 | 90 days supply | Qty: 90 | Fill #2

## 2019-05-11 DIAGNOSIS — R748 Abnormal levels of other serum enzymes: Secondary | ICD-10-CM | POA: Diagnosis not present

## 2019-05-11 DIAGNOSIS — Z7689 Persons encountering health services in other specified circumstances: Secondary | ICD-10-CM | POA: Diagnosis not present

## 2019-05-11 DIAGNOSIS — Z1211 Encounter for screening for malignant neoplasm of colon: Secondary | ICD-10-CM | POA: Diagnosis not present

## 2019-05-11 DIAGNOSIS — K625 Hemorrhage of anus and rectum: Secondary | ICD-10-CM | POA: Diagnosis not present

## 2019-07-30 MED FILL — METFORMIN HCL 500 MG TABS: 500 | 90 days supply | Qty: 180 | Fill #0

## 2019-07-30 MED FILL — LISINOPRIL 20 MG TABLET: 20 | 90 days supply | Qty: 180 | Fill #3

## 2019-07-30 MED FILL — LANSOPRAZOLE 30 MG CPDR: 30 | 90 days supply | Qty: 90 | Fill #0

## 2019-08-16 DIAGNOSIS — R05 Cough: Secondary | ICD-10-CM | POA: Diagnosis not present

## 2019-08-16 DIAGNOSIS — J22 Unspecified acute lower respiratory infection: Secondary | ICD-10-CM | POA: Diagnosis not present

## 2019-08-16 MED FILL — BROMPHENIR-PSEUDOEPHED-DM S: 30-2-10 | 13 days supply | Qty: 200 | Fill #0

## 2019-08-16 MED FILL — BROMIPHENIR-PSEUDOEPHED-DM: 30-2-10 | 13 days supply | Qty: 200 | Fill #0 | Status: TO

## 2019-08-16 MED FILL — AZITHROMYCIN 250 MG TABS: 250 | 5 days supply | Qty: 6 | Fill #0

## 2019-08-20 DIAGNOSIS — J40 Bronchitis, not specified as acute or chronic: Secondary | ICD-10-CM | POA: Diagnosis not present

## 2019-08-20 DIAGNOSIS — R05 Cough: Secondary | ICD-10-CM | POA: Diagnosis not present

## 2019-08-20 MED FILL — predniSONE 10 MG TABS: 10 | 12 days supply | Qty: 30 | Fill #0

## 2019-08-20 MED FILL — GUAIATUSSIN AC LIQUID: 100-10 | 7 days supply | Qty: 140 | Fill #0

## 2019-08-20 MED FILL — AZITHROMYCIN 250 MG TABS: 250 | 5 days supply | Qty: 6 | Fill #0

## 2020-02-01 DIAGNOSIS — M7731 Calcaneal spur, right foot: Secondary | ICD-10-CM | POA: Diagnosis not present

## 2020-02-01 DIAGNOSIS — M79671 Pain in right foot: Secondary | ICD-10-CM | POA: Diagnosis not present

## 2020-02-02 ENCOUNTER — Other Ambulatory Visit (HOSPITAL_COMMUNITY): Payer: Self-pay | Admitting: Internal Medicine

## 2020-02-02 DIAGNOSIS — M79671 Pain in right foot: Secondary | ICD-10-CM | POA: Diagnosis not present

## 2020-02-02 DIAGNOSIS — S92211A Displaced fracture of cuboid bone of right foot, initial encounter for closed fracture: Secondary | ICD-10-CM | POA: Diagnosis not present

## 2020-02-02 MED FILL — HYDROCODON-APAP 5-325: 5-325 | 3 days supply | Qty: 10 | Fill #0

## 2020-02-02 MED FILL — METFORMIN HCL 500 MG TABS: 500 | 90 days supply | Qty: 180 | Fill #0

## 2020-02-02 MED FILL — LANSOPRAZOLE 30 MG CPDR: 30 | 90 days supply | Qty: 90 | Fill #0

## 2020-02-02 MED FILL — LISINOPRIL 20 MG TABS: 20 | 90 days supply | Qty: 180 | Fill #0

## 2020-02-02 MED FILL — MELOXICAM 7.5 MG TABLET: 7.5 | 30 days supply | Qty: 60 | Fill #0

## 2020-02-08 DIAGNOSIS — S92211A Displaced fracture of cuboid bone of right foot, initial encounter for closed fracture: Secondary | ICD-10-CM | POA: Diagnosis not present

## 2020-02-23 DIAGNOSIS — S92211D Displaced fracture of cuboid bone of right foot, subsequent encounter for fracture with routine healing: Secondary | ICD-10-CM | POA: Diagnosis not present

## 2020-03-22 DIAGNOSIS — S92211A Displaced fracture of cuboid bone of right foot, initial encounter for closed fracture: Secondary | ICD-10-CM | POA: Diagnosis not present

## 2021-01-04 DIAGNOSIS — H5203 Hypermetropia, bilateral: Secondary | ICD-10-CM | POA: Diagnosis not present

## 2021-01-04 DIAGNOSIS — H524 Presbyopia: Secondary | ICD-10-CM | POA: Diagnosis not present

## 2021-01-04 DIAGNOSIS — H52223 Regular astigmatism, bilateral: Secondary | ICD-10-CM | POA: Diagnosis not present

## 2021-11-30 DIAGNOSIS — Z0000 Encounter for general adult medical examination without abnormal findings: Secondary | ICD-10-CM | POA: Diagnosis not present

## 2021-11-30 DIAGNOSIS — E119 Type 2 diabetes mellitus without complications: Secondary | ICD-10-CM | POA: Diagnosis not present

## 2021-11-30 DIAGNOSIS — Z125 Encounter for screening for malignant neoplasm of prostate: Secondary | ICD-10-CM | POA: Diagnosis not present

## 2021-11-30 DIAGNOSIS — E7800 Pure hypercholesterolemia, unspecified: Secondary | ICD-10-CM | POA: Diagnosis not present

## 2021-12-19 ENCOUNTER — Other Ambulatory Visit (HOSPITAL_BASED_OUTPATIENT_CLINIC_OR_DEPARTMENT_OTHER): Payer: Self-pay

## 2021-12-19 ENCOUNTER — Other Ambulatory Visit (HOSPITAL_COMMUNITY): Payer: Self-pay

## 2021-12-19 DIAGNOSIS — I10 Essential (primary) hypertension: Secondary | ICD-10-CM | POA: Diagnosis not present

## 2021-12-19 DIAGNOSIS — N529 Male erectile dysfunction, unspecified: Secondary | ICD-10-CM | POA: Diagnosis not present

## 2021-12-19 DIAGNOSIS — M62830 Muscle spasm of back: Secondary | ICD-10-CM | POA: Diagnosis not present

## 2021-12-19 DIAGNOSIS — K644 Residual hemorrhoidal skin tags: Secondary | ICD-10-CM | POA: Diagnosis not present

## 2021-12-19 DIAGNOSIS — E119 Type 2 diabetes mellitus without complications: Secondary | ICD-10-CM | POA: Diagnosis not present

## 2021-12-19 DIAGNOSIS — Z125 Encounter for screening for malignant neoplasm of prostate: Secondary | ICD-10-CM | POA: Diagnosis not present

## 2021-12-19 DIAGNOSIS — K219 Gastro-esophageal reflux disease without esophagitis: Secondary | ICD-10-CM | POA: Diagnosis not present

## 2021-12-19 DIAGNOSIS — Z0000 Encounter for general adult medical examination without abnormal findings: Secondary | ICD-10-CM | POA: Diagnosis not present

## 2021-12-19 DIAGNOSIS — E785 Hyperlipidemia, unspecified: Secondary | ICD-10-CM | POA: Diagnosis not present

## 2021-12-19 MED ORDER — LISINOPRIL 20 MG PO TABS
20.0000 mg | ORAL_TABLET | Freq: Two times a day (BID) | ORAL | 3 refills | Status: DC
  Filled 2021-12-19 – 2022-05-17 (×2): qty 180, 90d supply, fill #0
  Filled 2022-10-04: qty 180, 90d supply, fill #1

## 2021-12-19 MED ORDER — METFORMIN HCL 500 MG PO TABS
500.0000 mg | ORAL_TABLET | Freq: Two times a day (BID) | ORAL | 3 refills | Status: DC
  Filled 2021-12-19: qty 180, 90d supply, fill #0
  Filled 2022-05-17: qty 180, 90d supply, fill #1

## 2021-12-19 MED ORDER — LISINOPRIL 20 MG PO TABS
20.0000 mg | ORAL_TABLET | Freq: Two times a day (BID) | ORAL | 3 refills | Status: DC
  Filled 2021-12-19: qty 180, 90d supply, fill #0

## 2021-12-19 MED ORDER — HYDROCORT-PRAMOXINE (PERIANAL) 2.5-1 % EX CREA
TOPICAL_CREAM | CUTANEOUS | 2 refills | Status: DC
  Filled 2021-12-19: qty 30, 15d supply, fill #0
  Filled 2022-01-01: qty 30, 30d supply, fill #0

## 2021-12-19 MED ORDER — SILDENAFIL CITRATE 100 MG PO TABS
100.0000 mg | ORAL_TABLET | Freq: Every day | ORAL | 6 refills | Status: DC | PRN
  Filled 2021-12-19: qty 18, 90d supply, fill #0
  Filled 2022-05-17: qty 18, 90d supply, fill #1
  Filled 2022-10-04: qty 18, 90d supply, fill #2

## 2021-12-19 MED ORDER — SIMVASTATIN 20 MG PO TABS
20.0000 mg | ORAL_TABLET | Freq: Every day | ORAL | 3 refills | Status: DC
  Filled 2021-12-19: qty 90, 90d supply, fill #0

## 2021-12-19 MED ORDER — DIAZEPAM 5 MG PO TABS
5.0000 mg | ORAL_TABLET | Freq: Two times a day (BID) | ORAL | 5 refills | Status: DC | PRN
  Filled 2021-12-19 – 2022-01-01 (×2): qty 60, 30d supply, fill #0

## 2021-12-19 MED ORDER — LANSOPRAZOLE 30 MG PO CPDR
30.0000 mg | DELAYED_RELEASE_CAPSULE | Freq: Every day | ORAL | 3 refills | Status: DC
Start: 2021-12-19 — End: 2023-05-19
  Filled 2021-12-19: qty 90, 90d supply, fill #0
  Filled 2022-05-17: qty 90, 90d supply, fill #1

## 2021-12-21 ENCOUNTER — Other Ambulatory Visit (HOSPITAL_COMMUNITY): Payer: Self-pay

## 2021-12-24 ENCOUNTER — Other Ambulatory Visit: Payer: Self-pay

## 2022-01-01 ENCOUNTER — Other Ambulatory Visit (HOSPITAL_COMMUNITY): Payer: Self-pay

## 2022-01-01 MED ORDER — LIDOCAINE-HYDROCORT (PERIANAL) 3-0.5 % EX CREA
TOPICAL_CREAM | CUTANEOUS | 2 refills | Status: DC
  Filled 2022-01-01: qty 196, 14d supply, fill #0

## 2022-01-01 MED ORDER — LIDOCAINE-HYDROCORT (PERIANAL) 3-0.5 % EX CREA
TOPICAL_CREAM | CUTANEOUS | 0 refills | Status: DC
  Filled 2022-01-01: qty 196, 30d supply, fill #0

## 2022-01-01 MED ORDER — DIAZEPAM 5 MG PO TABS: 5.0000 mg | ORAL_TABLET | Freq: Every evening | ORAL | 2 refills | Status: DC

## 2022-01-02 ENCOUNTER — Other Ambulatory Visit (HOSPITAL_COMMUNITY): Payer: Self-pay

## 2022-01-12 ENCOUNTER — Other Ambulatory Visit (HOSPITAL_COMMUNITY): Payer: Self-pay

## 2022-02-13 ENCOUNTER — Other Ambulatory Visit (HOSPITAL_COMMUNITY): Payer: Self-pay

## 2022-02-13 MED ORDER — DIAZEPAM 5 MG PO TABS
5.0000 mg | ORAL_TABLET | ORAL | 1 refills | Status: DC
  Filled 2022-02-13: qty 180, 90d supply, fill #0
  Filled 2022-05-17: qty 180, 90d supply, fill #1

## 2022-05-17 ENCOUNTER — Other Ambulatory Visit (HOSPITAL_COMMUNITY): Payer: Self-pay

## 2022-06-25 ENCOUNTER — Other Ambulatory Visit (HOSPITAL_COMMUNITY): Payer: Self-pay

## 2022-06-25 MED ORDER — CHLORHEXIDINE GLUCONATE 0.12 % MT SOLN
OROMUCOSAL | 3 refills | Status: DC
  Filled 2022-06-25: qty 473, 16d supply, fill #0

## 2022-07-03 ENCOUNTER — Other Ambulatory Visit (HOSPITAL_COMMUNITY): Payer: Self-pay

## 2022-10-04 ENCOUNTER — Other Ambulatory Visit (HOSPITAL_COMMUNITY): Payer: Self-pay

## 2022-10-04 ENCOUNTER — Encounter (HOSPITAL_COMMUNITY): Payer: Self-pay

## 2022-10-04 ENCOUNTER — Emergency Department (HOSPITAL_COMMUNITY)
Admission: EM | Admit: 2022-10-04 | Discharge: 2022-10-04 | Disposition: A | Discharge status: Home / Self Care | Payer: Commercial Managed Care - PPO | Attending: Emergency Medicine | Admitting: Emergency Medicine

## 2022-10-04 ENCOUNTER — Emergency Department (HOSPITAL_COMMUNITY): Payer: Commercial Managed Care - PPO

## 2022-10-04 ENCOUNTER — Other Ambulatory Visit: Payer: Self-pay

## 2022-10-04 DIAGNOSIS — R112 Nausea with vomiting, unspecified: Secondary | ICD-10-CM | POA: Diagnosis not present

## 2022-10-04 DIAGNOSIS — R1012 Left upper quadrant pain: Secondary | ICD-10-CM | POA: Diagnosis not present

## 2022-10-04 DIAGNOSIS — R188 Other ascites: Secondary | ICD-10-CM | POA: Diagnosis not present

## 2022-10-04 DIAGNOSIS — K760 Fatty (change of) liver, not elsewhere classified: Secondary | ICD-10-CM | POA: Diagnosis not present

## 2022-10-04 DIAGNOSIS — K56600 Partial intestinal obstruction, unspecified as to cause: Secondary | ICD-10-CM | POA: Insufficient documentation

## 2022-10-04 DIAGNOSIS — E871 Hypo-osmolality and hyponatremia: Secondary | ICD-10-CM | POA: Diagnosis not present

## 2022-10-04 LAB — URINALYSIS, ROUTINE W REFLEX MICROSCOPIC
Bilirubin Urine: NEGATIVE
Glucose, UA: 150 mg/dL — AB
Hgb urine dipstick: NEGATIVE
Ketones, ur: 5 mg/dL — AB
Leukocytes,Ua: NEGATIVE
Nitrite: NEGATIVE
Protein, ur: 30 mg/dL — AB
Specific Gravity, Urine: 1.034 — ABNORMAL HIGH (ref 1.005–1.030)
pH: 5 (ref 5.0–8.0)

## 2022-10-04 LAB — COMPREHENSIVE METABOLIC PANEL WITH GFR
ALT: 31 U/L (ref 0–44)
AST: 21 U/L (ref 15–41)
Albumin: 4.1 g/dL (ref 3.5–5.0)
Alkaline Phosphatase: 41 U/L (ref 38–126)
Anion gap: 10 (ref 5–15)
BUN: 16 mg/dL (ref 6–20)
CO2: 22 mmol/L (ref 22–32)
Calcium: 9.1 mg/dL (ref 8.9–10.3)
Chloride: 99 mmol/L (ref 98–111)
Creatinine, Ser: 0.71 mg/dL (ref 0.61–1.24)
GFR, Estimated: >60 mL/min
Glucose, Bld: 201 mg/dL — ABNORMAL HIGH (ref 70–99)
Potassium: 3.8 mmol/L (ref 3.5–5.1)
Sodium: 131 mmol/L — ABNORMAL LOW (ref 135–145)
Total Bilirubin: 0.8 mg/dL (ref 0.3–1.2)
Total Protein: 8.2 g/dL — ABNORMAL HIGH (ref 6.5–8.1)

## 2022-10-04 LAB — CBC
HCT: 49.4 % (ref 39.0–52.0)
Hemoglobin: 16.6 g/dL (ref 13.0–17.0)
MCH: 30.2 pg (ref 26.0–34.0)
MCHC: 33.6 g/dL (ref 30.0–36.0)
MCV: 89.8 fL (ref 80.0–100.0)
Platelets: 279 10*3/uL (ref 150–400)
RBC: 5.5 MIL/uL (ref 4.22–5.81)
RDW: 12.5 % (ref 11.5–15.5)
WBC: 8.5 10*3/uL (ref 4.0–10.5)
nRBC: 0 % (ref 0.0–0.2)

## 2022-10-04 LAB — LIPASE, BLOOD: Lipase: 23 U/L (ref 11–51)

## 2022-10-04 MED ORDER — BISMUTH SUBSALICYLATE 262 MG PO CHEW
524.0000 mg | CHEWABLE_TABLET | ORAL | 0 refills | Status: DC | PRN
  Filled 2022-10-04: qty 30, 15d supply, fill #0

## 2022-10-04 MED ORDER — BISACODYL 10 MG RE SUPP
10.0000 mg | RECTAL | 0 refills | Status: DC | PRN
  Filled 2022-10-04: qty 12, 12d supply, fill #0

## 2022-10-04 MED ORDER — OXYCODONE-ACETAMINOPHEN 5-325 MG PO TABS
1.0000 | ORAL_TABLET | Freq: Two times a day (BID) | ORAL | 0 refills | Status: DC | PRN
  Filled 2022-10-04: qty 4, 2d supply, fill #0

## 2022-10-04 MED ORDER — SODIUM CHLORIDE (PF) 0.9 % IJ SOLN
INTRAMUSCULAR | Status: AC
  Filled 2022-10-04: qty 50

## 2022-10-04 MED ORDER — ACETAMINOPHEN 500 MG PO TABS
500.0000 mg | ORAL_TABLET | Freq: Four times a day (QID) | ORAL | 0 refills | Status: AC | PRN
  Filled 2022-10-04: qty 30, 8d supply, fill #0

## 2022-10-04 MED ORDER — SODIUM CHLORIDE 0.9 % IV BOLUS
500.0000 mL | Freq: Once | INTRAVENOUS | Status: AC
  Administered 2022-10-04: 500 mL via INTRAVENOUS

## 2022-10-04 MED ORDER — IOHEXOL 300 MG/ML  SOLN
100.0000 mL | Freq: Once | INTRAMUSCULAR | Status: AC | PRN
  Administered 2022-10-04: 100 mL via INTRAVENOUS

## 2022-10-04 MED ORDER — POLYETHYLENE GLYCOL 3350 17 GM/SCOOP PO POWD
17.0000 g | Freq: Every day | ORAL | 0 refills | Status: DC
  Filled 2022-10-04: qty 238, 14d supply, fill #0

## 2022-10-04 MED ORDER — FENTANYL CITRATE PF 50 MCG/ML IJ SOSY
50.0000 ug | PREFILLED_SYRINGE | INTRAMUSCULAR | Status: DC | PRN
  Filled 2022-10-04: qty 1

## 2022-10-04 NOTE — ED Notes (Signed)
Pt provided cup for UA

## 2022-10-04 NOTE — ED Provider Notes (Signed)
Gary Ramos Provider Note   CSN: [id number] Arrival date & time: 10/04/22  0710     History  Chief Complaint  Patient presents with   Abdominal Pain    Gary Ramos is a 57 y.o. male.  HPI    57 year old male comes in with chief complaint of abdominal pain.  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  He comes in with chief complaint of worsening abdominal pain.  Patient has had abdominal discomfort since the last 4 days.  The pain is generalized, usually in the upper quadrants and fairly constant with waxing and waning intensity.  He has had emesis x 2 on day 1.  Subsequently has had nausea and waves without emesis.  He has had a bowel movement every day, but the last BM, which was yesterday was slightly tapered.  He is passing flatus.  He indicates that his symptoms feel better after BM or passing gas.  He feels bloated.  He has lost appetite.  Home Medications Prior to Admission medications   Medication Sig Start Date End Date Taking? Authorizing Provider  acetaminophen (TYLENOL) 500 MG tablet Take 1 tablet (500 mg total) by mouth every 6 (six) hours as needed. 10/04/22  Yes Derwood Kaplan, MD  bisacodyl (DULCOLAX) 10 MG suppository Place 1 suppository (10 mg total) rectally as needed for moderate constipation. 10/04/22  Yes Derwood Kaplan, MD  bismuth subsalicylate (PEPTO-BISMOL) 262 MG chewable tablet Chew 2 tablets (524 mg total) by mouth as needed. 10/04/22  Yes Derwood Kaplan, MD  oxyCODONE-acetaminophen (PERCOCET/ROXICET) 5-325 MG tablet Take 1 tablet by mouth every 12 (twelve) hours as needed for severe pain. 10/04/22  Yes Caryl Manas, MD  polyethylene glycol (MIRALAX / GLYCOLAX) 17 g packet Take 17 g by mouth daily. 10/04/22  Yes Tariq Pernell, MD  chlorhexidine (PERIDEX) 0.12 % solution RINSE MOUTH WITH (1 CAPFUL) FOR 30 SECONDS MORNING AND EVENING AFTER TOOTHBRUSHING. EXPECTORATE AFTER  RINSING, DO NOT SWALLOW 06/25/22     chlorpheniramine-HYDROcodone (TUSSIONEX) 10-8 MG/5ML SUER Take 5 mLs by mouth as needed.  11/15/16   [provider]  diazepam (VALIUM) 5 MG tablet Take 5 mg every 6 (six) hours as needed by mouth for muscle spasms.     [provider]  diazepam (VALIUM) 5 MG tablet Take 1 tablet by mouth 2 times daily. (Take 1 tablet at night before bedtime for the back, and 1 tablet during the day only as needed.) 12/19/21     diazepam (VALIUM) 5 MG tablet Take 1 tablet (5 mg total) by mouth every evening before bed for back and 1 during the day only as needed 01/01/22     diazepam (VALIUM) 5 MG tablet Take 1 tablet (5 mg total) by mouth at night before bed for back and 1 during the day only as needed. 02/13/22     hydrocortisone-pramoxine (ANALPRAM HC SINGLES) 2.5-1 % rectal cream Use rectally 2 times a day as needed for flare ups 12/19/21     lansoprazole (PREVACID) 30 MG capsule Take 30 mg by mouth daily.     [provider]  lansoprazole (PREVACID) 30 MG capsule TAKE 1 CAPSULE BY MOUTH ONCE A DAY BEFORE A MEAL 02/02/20 02/01/21  Pearson Grippe, MD  lansoprazole (PREVACID) 30 MG capsule Take 1 capsule (30 mg total) by mouth daily before a meal 12/19/21     Lidocaine-Hydrocort, Perianal, 3-0.5 % CREA Use 1 tube rectally 2 times a day  as needed for flare ups 01/01/22     lisinopril (ZESTRIL) 20 MG tablet Take 20 mg by mouth 2 (two) times daily.    [provider]  lisinopril (ZESTRIL) 20 MG tablet TAKE 1 TABLET BY MOUTH TWO TIMES DAILY FOR BLOOD PRESSURE 02/02/20 02/01/21  Pearson Grippe, MD  lisinopril (ZESTRIL) 20 MG tablet Take 1 tablet (20 mg total) by mouth 2 (two) times daily for BP. 12/19/21     lisinopril (ZESTRIL) 20 MG tablet Take 1 tablet (20 mg total) by mouth in the morning and at bedtime for blood pressure. 12/19/21     metFORMIN (GLUCOPHAGE) 500 MG tablet Take 500 mg by mouth 2 (two) times daily.  10/31/16   [provider]  metFORMIN  (GLUCOPHAGE) 500 MG tablet TAKE 1 TABLET BY MOUTH TWICE A DAY FOR DIABETES 02/02/20 02/01/21  Pearson Grippe, MD  metFORMIN (GLUCOPHAGE) 500 MG tablet Take 1 tablet (500 mg total) by mouth 2 (two) times daily for diabetes. 12/19/21     sildenafil (VIAGRA) 100 MG tablet Take 1 tablet as directed by mouth. 11/15/16   [provider]  sildenafil (VIAGRA) 100 MG tablet Take 1 tablet (100 mg total) by mouth daily as needed. 12/19/21     simvastatin (ZOCOR) 20 MG tablet Take 1 tablet (20 mg total) by mouth daily for cholesterol. 12/19/21     trimethoprim-polymyxin b (POLYTRIM) ophthalmic solution Place 2 drops into both eyes every 4 (four) hours. 01/23/19   Bennie Pierini, FNP      Allergies    Patient has no known allergies.    Review of Systems   Review of Systems  All other systems reviewed and are negative.   Physical Exam Updated Vital Signs BP (!) 144/90   Pulse 86   Temp 97.6 F (36.4 C)   Resp 18   Ht 5\' 8"  (1.727 m)   Wt 99.8 kg   SpO2 98%   BMI 33.45 kg/m  Physical Exam Vitals and nursing note reviewed.  Constitutional:      Appearance: He is well-developed.  HENT:     Head: Atraumatic.  Cardiovascular:     Rate and Rhythm: Normal rate.  Pulmonary:     Effort: Pulmonary effort is normal.  Abdominal:     General: There is distension.     Palpations: Abdomen is soft.     Tenderness: There is abdominal tenderness in the epigastric area, periumbilical area and left upper quadrant.     Hernia: No hernia is present.  Musculoskeletal:     Cervical back: Neck supple.  Skin:    General: Skin is warm.  Neurological:     Mental Status: He is alert and oriented to person, place, and time.     ED Results / Procedures / Treatments   Labs (all labs ordered are listed, but only abnormal results are displayed) Labs Reviewed  COMPREHENSIVE METABOLIC PANEL - Abnormal; Notable for the following components:      Result Value   Sodium 131 (*)    Glucose, Bld 201 (*)     Total Protein 8.2 (*)    All other components within normal limits  URINALYSIS, ROUTINE W REFLEX MICROSCOPIC - Abnormal; Notable for the following components:   Specific Gravity, Urine 1.034 (*)    Glucose, UA 150 (*)    Ketones, ur 5 (*)    Protein, ur 30 (*)    Bacteria, UA RARE (*)    All other components within normal limits  LIPASE, BLOOD  CBC    EKG None  Radiology CT ABDOMEN PELVIS W CONTRAST  Result Date: 10/04/2022 CLINICAL DATA:  Left upper quadrant abdominal pain since Tuesday with some vomiting and nausea. Remote history of a bowel resection EXAM: CT ABDOMEN AND PELVIS WITH CONTRAST TECHNIQUE: Multidetector CT imaging of the abdomen and pelvis was performed using the standard protocol following bolus administration of intravenous contrast. RADIATION DOSE REDUCTION: This exam was performed according to the departmental dose-optimization program which includes automated exposure control, adjustment of the mA and/or kV according to patient size and/or use of iterative reconstruction technique. CONTRAST:  OMNIPAQUE IOHEXOL 300 MG/ML  SOLN COMPARISON:  Report of the CT scan of January 2000. Chest CT scan November 2018 FINDINGS: Lower chest: Slight linear opacity lung bases likely scar or atelectasis. 5 mm nodule in the middle lobe on series 6 image 16. unchanged since 2018 demonstrating long-term stability. No additional imaging follow-up. Coronary artery calcifications are seen. Please correlate for other coronary risk factors. Trace pericardial fluid. Hepatobiliary: Diffuse fatty liver infiltration. No space-occupying liver lesion. The gallbladder is nondilated. Patent portal vein. Pancreas: Unremarkable. No pancreatic ductal dilatation or surrounding inflammatory changes. Spleen: Normal in size without focal abnormality. Adrenals/Urinary Tract: Right adrenal gland is preserved. The left is diffusely thickened, nonspecific. Unchanged from the prior chest CT scan of 2018. No  enhancing renal mass or collecting system dilatation. The ureters have normal course and caliber extending down to the normal caliber, underdistended urinary bladder. Stomach/Bowel: On this non oral contrast exam the large bowel is of normal course and caliber with some scattered stool. The appendix is not well seen in the right lower quadrant but no pericecal inflammatory changes. Stomach is nondilated. Small bowel however has several loops which are with debris, fluid and dilated up to 4.8 cm. There are some adjacent stranding and vascular engorgement. Transition along the left midabdomen. No pneumatosis or portal venous gas identified at this time this has a relatively slow transition. Possibilities would include ileus versus developing partial obstruction. Vascular/Lymphatic: Aortic atherosclerosis. No enlarged abdominal or pelvic lymph nodes. A few small mesenteric nodes identified, nonpathologic. Reproductive: Prostate is unremarkable. Other: No free intra-air.  Mild ascites. Musculoskeletal: Scattered degenerative changes along the spine and pelvis. IMPRESSION: Moderately dilated loops of small bowel diffusely with some adjacent inflammatory stranding and fluid. Slow transition along the left hemiabdomen. Possibilities would include ileus versus a partial obstruction. Please correlate clinical presentation and follow-up. Fatty liver infiltration. Electronically Signed   By: Karen Kays M.D.   On: 10/04/2022 10:11    Procedures Procedures    Medications Ordered in ED Medications  fentaNYL (SUBLIMAZE) injection 50 mcg (has no administration in time range)  sodium chloride 0.9 % bolus 500 mL (0 mLs Intravenous Stopped 10/04/22 0817)  iohexol (OMNIPAQUE) 300 MG/ML solution 100 mL (100 mLs Intravenous Contrast Given 10/04/22 0827)    ED Course/ Medical Decision Making/ A&P                                 Medical Decision Making Amount and/or Complexity of Data Reviewed Labs: ordered. Radiology:  ordered.  Risk OTC drugs. Prescription drug management.   This patient presents to the ED with chief complaint(s) of abdominal discomfort, bloating with pertinent past medical history of partial bowel resection because of diverticulitis.The complaint involves an extensive differential diagnosis and also carries with it a high risk of complications and morbidity.    The  differential diagnosis includes : Ileus, small bowel obstruction, early or partial small bowel obstruction, colitis, enteritis. Mild ketones in the urine.  Mild hyponatremia, which is incidental finding.  CBC shows no leukocytosis.   The initial plan is to get basic labs and get CT scan.   Independent labs interpretation:  The following labs were independently interpreted: Blood work is overall reassuring.  Independent visualization and interpretation of imaging: - I independently visualized the following imaging with scope of interpretation limited to determining acute life threatening conditions related to emergency care: CT abdomen and pelvis, which revealed distended bowels with air-fluid level and gas.  I did not see clear evidence of SBO, radiology read also consistent with early small bowel obstruction, partial small bowel obstruction or ileus.  Treatment and Reassessment: Patient reassessed.  He feels the same as he did when he came.  Pain continues to be waxing and waning.  Still passing flatus.  No nausea or vomiting.  Did not require any as needed pain medicine here -mainly because he wants to drive back home.  I do not think he needs NG tube as there is no nausea or vomiting that severe.  He is still passing flatus.  Plan is for him to go on clear liquid diet.  We will give him medications for symptom management.  Return precautions discussed and he will come back to the ER if he starts having severe pain, intractable nausea and vomiting, stops passing flatus.    Final Clinical Impression(s) / ED  Diagnoses Final diagnoses:  Partial small bowel obstruction (HCC)    Rx / DC Orders ED Discharge Orders          Ordered    polyethylene glycol (MIRALAX / GLYCOLAX) 17 g packet  Daily        10/04/22 1114    bismuth subsalicylate (PEPTO-BISMOL) 262 MG chewable tablet  As needed        10/04/22 1114    bisacodyl (DULCOLAX) 10 MG suppository  As needed        10/04/22 1114    acetaminophen (TYLENOL) 500 MG tablet  Every 6 hours PRN        10/04/22 1114    oxyCODONE-acetaminophen (PERCOCET/ROXICET) 5-325 MG tablet  Every 12 hours PRN        10/04/22 1114              Derwood Kaplan, MD 10/04/22 1121

## 2022-10-04 NOTE — ED Triage Notes (Signed)
Patient has left upper quadrant abdominal pain since Tuesday. Vomited x2. Nausea comes in waves. History of bowel resection 30 years ago. Has had 3 bowel movements, last one was last night.

## 2022-10-04 NOTE — Discharge Instructions (Addendum)
We have provided you with instructions on bowel obstruction.  However, initial workup indicates perhaps early obstruction or partial small bowel obstruction.  We hope that with clear liquid diet and laxatives your symptoms will improve. Take Tylenol every 6 hours for pain control.  Additionally take Pepto-Bismol for gas-like symptoms.  Please return to the emergency room if you start having worsening abdominal pain, severe nausea and vomiting, he stopped defecating or passing flatus.

## 2022-10-07 ENCOUNTER — Other Ambulatory Visit (HOSPITAL_COMMUNITY): Payer: Self-pay

## 2022-10-07 MED ORDER — DIAZEPAM 5 MG PO TABS
ORAL_TABLET | ORAL | 2 refills | Status: DC
Start: 2022-10-07 — End: 2023-05-19
  Filled 2022-10-07: qty 60, 30d supply, fill #0

## 2022-10-08 ENCOUNTER — Other Ambulatory Visit: Payer: Self-pay

## 2022-12-16 DIAGNOSIS — Z0000 Encounter for general adult medical examination without abnormal findings: Secondary | ICD-10-CM | POA: Diagnosis not present

## 2022-12-16 DIAGNOSIS — Z125 Encounter for screening for malignant neoplasm of prostate: Secondary | ICD-10-CM | POA: Diagnosis not present

## 2022-12-16 DIAGNOSIS — E119 Type 2 diabetes mellitus without complications: Secondary | ICD-10-CM | POA: Diagnosis not present

## 2022-12-23 ENCOUNTER — Other Ambulatory Visit (HOSPITAL_COMMUNITY): Payer: Self-pay | Admitting: Registered Nurse

## 2022-12-23 ENCOUNTER — Other Ambulatory Visit (HOSPITAL_COMMUNITY): Payer: Self-pay

## 2022-12-23 DIAGNOSIS — N529 Male erectile dysfunction, unspecified: Secondary | ICD-10-CM | POA: Diagnosis not present

## 2022-12-23 DIAGNOSIS — Z0000 Encounter for general adult medical examination without abnormal findings: Secondary | ICD-10-CM | POA: Diagnosis not present

## 2022-12-23 DIAGNOSIS — E1165 Type 2 diabetes mellitus with hyperglycemia: Secondary | ICD-10-CM | POA: Diagnosis not present

## 2022-12-23 DIAGNOSIS — E785 Hyperlipidemia, unspecified: Secondary | ICD-10-CM | POA: Diagnosis not present

## 2022-12-23 MED ORDER — SIMVASTATIN 20 MG PO TABS
20.0000 mg | ORAL_TABLET | Freq: Every day | ORAL | 3 refills | Status: DC
  Filled 2022-12-23: qty 90, 90d supply, fill #0

## 2022-12-23 MED ORDER — SILDENAFIL CITRATE 100 MG PO TABS
100.0000 mg | ORAL_TABLET | Freq: Every day | ORAL | 6 refills | Status: AC | PRN
  Filled 2022-12-23: qty 6, 30d supply, fill #0
  Filled 2023-01-24: qty 6, 30d supply, fill #1
  Filled 2023-02-26: qty 6, 30d supply, fill #2
  Filled 2023-04-27: qty 6, 30d supply, fill #3
  Filled 2023-06-11: qty 18, 90d supply, fill #4
  Filled 2023-08-25: qty 18, 90d supply, fill #5

## 2022-12-23 MED ORDER — METFORMIN HCL 500 MG PO TABS
500.0000 mg | ORAL_TABLET | Freq: Two times a day (BID) | ORAL | 3 refills | Status: AC
  Filled 2022-12-23: qty 180, 90d supply, fill #0
  Filled 2023-04-27: qty 180, 90d supply, fill #1
  Filled 2023-08-25: qty 180, 90d supply, fill #2
  Filled 2023-12-08: qty 180, 90d supply, fill #3

## 2022-12-23 MED ORDER — DIAZEPAM 5 MG PO TABS
5.0000 mg | ORAL_TABLET | ORAL | 2 refills | Status: DC
Start: 2022-12-23 — End: 2023-04-28
  Filled 2022-12-23: qty 60, 30d supply, fill #0
  Filled 2023-01-24: qty 60, 30d supply, fill #1
  Filled 2023-02-26: qty 60, 30d supply, fill #2

## 2022-12-23 MED ORDER — LANSOPRAZOLE 30 MG PO CPDR
30.0000 mg | DELAYED_RELEASE_CAPSULE | Freq: Every day | ORAL | 3 refills | Status: AC
  Filled 2022-12-23: qty 90, 90d supply, fill #0
  Filled 2023-04-27: qty 90, 90d supply, fill #1
  Filled 2023-08-25: qty 90, 90d supply, fill #2
  Filled 2023-12-08: qty 90, 90d supply, fill #3

## 2022-12-23 MED ORDER — LISINOPRIL 20 MG PO TABS
20.0000 mg | ORAL_TABLET | Freq: Two times a day (BID) | ORAL | 3 refills | Status: AC
  Filled 2022-12-23: qty 180, 90d supply, fill #0
  Filled 2023-04-27: qty 180, 90d supply, fill #1
  Filled 2023-08-25: qty 180, 90d supply, fill #2
  Filled 2023-12-08: qty 180, 90d supply, fill #3

## 2022-12-24 ENCOUNTER — Other Ambulatory Visit (HOSPITAL_COMMUNITY): Payer: Self-pay

## 2023-01-08 DIAGNOSIS — H5203 Hypermetropia, bilateral: Secondary | ICD-10-CM | POA: Diagnosis not present

## 2023-01-08 DIAGNOSIS — H52223 Regular astigmatism, bilateral: Secondary | ICD-10-CM | POA: Diagnosis not present

## 2023-01-08 DIAGNOSIS — H524 Presbyopia: Secondary | ICD-10-CM | POA: Diagnosis not present

## 2023-01-24 ENCOUNTER — Other Ambulatory Visit (HOSPITAL_COMMUNITY): Payer: Self-pay

## 2023-02-26 ENCOUNTER — Other Ambulatory Visit (HOSPITAL_COMMUNITY): Payer: Self-pay

## 2023-03-18 ENCOUNTER — Ambulatory Visit (HOSPITAL_COMMUNITY)
Admission: RE | Admit: 2023-03-18 | Discharge: 2023-03-18 | Disposition: A | Discharge status: Home / Self Care | Payer: Self-pay | Source: Ambulatory Visit | Attending: Registered Nurse | Admitting: Registered Nurse

## 2023-03-18 DIAGNOSIS — E1165 Type 2 diabetes mellitus with hyperglycemia: Secondary | ICD-10-CM | POA: Insufficient documentation

## 2023-04-08 DIAGNOSIS — E119 Type 2 diabetes mellitus without complications: Secondary | ICD-10-CM | POA: Diagnosis not present

## 2023-04-08 DIAGNOSIS — Z1211 Encounter for screening for malignant neoplasm of colon: Secondary | ICD-10-CM | POA: Diagnosis not present

## 2023-04-08 DIAGNOSIS — K219 Gastro-esophageal reflux disease without esophagitis: Secondary | ICD-10-CM | POA: Diagnosis not present

## 2023-04-08 DIAGNOSIS — I10 Essential (primary) hypertension: Secondary | ICD-10-CM | POA: Diagnosis not present

## 2023-04-27 ENCOUNTER — Other Ambulatory Visit (HOSPITAL_COMMUNITY): Payer: Self-pay

## 2023-04-28 ENCOUNTER — Other Ambulatory Visit (HOSPITAL_COMMUNITY): Payer: Self-pay

## 2023-04-28 ENCOUNTER — Other Ambulatory Visit: Payer: Self-pay

## 2023-04-28 MED ORDER — DIAZEPAM 5 MG PO TABS
5.0000 mg | ORAL_TABLET | Freq: Two times a day (BID) | ORAL | 0 refills | Status: DC
  Filled 2023-04-28: qty 60, 30d supply, fill #0

## 2023-04-29 ENCOUNTER — Other Ambulatory Visit (HOSPITAL_COMMUNITY): Payer: Self-pay

## 2023-04-29 ENCOUNTER — Other Ambulatory Visit (HOSPITAL_BASED_OUTPATIENT_CLINIC_OR_DEPARTMENT_OTHER): Payer: Self-pay

## 2023-05-06 ENCOUNTER — Ambulatory Visit: Payer: Self-pay | Admitting: Cardiovascular Disease

## 2023-05-19 ENCOUNTER — Ambulatory Visit: Payer: Self-pay | Attending: Cardiovascular Disease | Admitting: Cardiovascular Disease

## 2023-05-19 ENCOUNTER — Other Ambulatory Visit (HOSPITAL_COMMUNITY): Payer: Self-pay

## 2023-05-19 ENCOUNTER — Encounter: Payer: Self-pay | Admitting: Cardiovascular Disease

## 2023-05-19 VITALS — BP 114/78 | HR 69 | Ht 68.0 in | Wt 219.6 lb

## 2023-05-19 DIAGNOSIS — I10 Essential (primary) hypertension: Secondary | ICD-10-CM

## 2023-05-19 DIAGNOSIS — E119 Type 2 diabetes mellitus without complications: Secondary | ICD-10-CM | POA: Diagnosis not present

## 2023-05-19 DIAGNOSIS — R931 Abnormal findings on diagnostic imaging of heart and coronary circulation: Secondary | ICD-10-CM | POA: Diagnosis not present

## 2023-05-19 DIAGNOSIS — E782 Mixed hyperlipidemia: Secondary | ICD-10-CM | POA: Diagnosis not present

## 2023-05-19 MED ORDER — ROSUVASTATIN CALCIUM 20 MG PO TABS
20.0000 mg | ORAL_TABLET | Freq: Every day | ORAL | 3 refills | Status: AC
Start: 2023-05-19 — End: ?
  Filled 2023-05-19: qty 90, 90d supply, fill #0
  Filled 2023-08-25: qty 90, 90d supply, fill #1
  Filled 2023-12-08: qty 90, 90d supply, fill #2

## 2023-05-19 MED ORDER — ASPIRIN 81 MG PO TBEC: 81.0000 mg | DELAYED_RELEASE_TABLET | Freq: Every day | ORAL | Status: AC

## 2023-05-19 NOTE — Progress Notes (Signed)
 Cardiology Office Note:    Date:  05/19/2023   ID:  Gary Ramos, DOB 12-18-1965, MRN [id number]  PCP:  Vanita Gens, MD   [facility] Providers Cardiologist:  None     Referring MD: Jhon Moselle, FNP   Chief Complaint  Patient presents with   Coronary Artery Disease    History of Present Illness:    Gary Ramos is a 58 y.o. male presenting for evaluation of elevated coronary calcium  score. The patient has uncontrolled diabetes with hyperglycemia and was referred for a coronary calcium  score in February 2025 showing an overall score of 166, placing him in the 80th percentile.   The patient is here alone today.  He is doing fine from a cardiac perspective.  He denies any chest pain, chest pressure, or shortness of breath.  He said no lightheadedness, or palpitations, edema, orthopnea, or PND.  He has no complaints today.  He is physically active with work and denies any exertional symptoms.  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  He has previously been noncompliant with medication, but has been doing much better with this since he saw his primary care physician in November of last year.  Current Medications: Current Meds  Medication Sig   acetaminophen  (TYLENOL ) 500 MG tablet Take 1 tablet (500 mg total) by mouth every 6 (six) hours as needed.   aspirin  EC 81 MG tablet Take 1 tablet (81 mg total) by mouth daily. Swallow whole.   diazepam  (VALIUM ) 5 MG tablet Take 5 mg every 6 (six) hours as needed by mouth for muscle spasms.    lansoprazole  (PREVACID ) 30 MG capsule Take 1 capsule (30 mg total) by mouth daily before a meal   lisinopril  (ZESTRIL ) 20 MG tablet Take 1 tablet (20 mg total) by mouth 2 (two) times daily for blood pressure   metFORMIN  (GLUCOPHAGE ) 500 MG tablet Take 1 tablet (500 mg total) by mouth 2 (two) times daily for diabetes   rosuvastatin  (CRESTOR )  20 MG tablet Take 1 tablet (20 mg total) by mouth daily.   sildenafil  (VIAGRA ) 100 MG tablet Take 1 tablet (100 mg total) by mouth daily as needed.   [DISCONTINUED] bisacodyl  (DULCOLAX) 10 MG suppository Place 1 suppository (10 mg total) rectally as needed for moderate constipation.   [DISCONTINUED] bismuth  subsalicylate (PEPTO-BISMOL) 262 MG chewable tablet Chew 2 tablets (524 mg total) by mouth as needed.   [DISCONTINUED] chlorhexidine  (PERIDEX ) 0.12 % solution RINSE MOUTH WITH (1 CAPFUL) FOR 30 SECONDS MORNING AND EVENING AFTER TOOTHBRUSHING. EXPECTORATE AFTER RINSING, DO NOT SWALLOW   [DISCONTINUED] chlorpheniramine-HYDROcodone  (TUSSIONEX) 10-8 MG/5ML SUER Take 5 mLs by mouth as needed.    [DISCONTINUED] diazepam  (VALIUM ) 5 MG tablet Take 1 tablet by mouth 2 times daily. (Take 1 tablet at night before bedtime for the back, and 1 tablet during the day only as needed.)   [DISCONTINUED] diazepam  (VALIUM ) 5 MG tablet Take 1 tablet (5 mg total) by mouth every evening before bed for back and 1 during the day only as needed   [DISCONTINUED] diazepam  (VALIUM ) 5 MG tablet Take 1 tablet by mouth at night before bed for back and 1 tablet during the day only as needed   [DISCONTINUED] diazepam  (VALIUM ) 5 MG tablet Take 1 tablet by mouth at night before bed for back and 1 tablet during the day only as  needed   [DISCONTINUED] hydrocortisone -pramoxine (ANALPRAM  HC SINGLES) 2.5-1 % rectal cream Use rectally 2 times a day as needed for flare ups   [DISCONTINUED] lansoprazole  (PREVACID ) 30 MG capsule Take 30 mg by mouth daily.    [DISCONTINUED] lansoprazole  (PREVACID ) 30 MG capsule Take 1 capsule (30 mg total) by mouth daily before a meal   [DISCONTINUED] Lidocaine -Hydrocort , Perianal, 3-0.5 % CREA Use 1 tube rectally 2 times a day as needed for flare ups   [DISCONTINUED] lisinopril  (ZESTRIL ) 20 MG tablet Take 20 mg by mouth 2 (two) times daily.   [DISCONTINUED] lisinopril  (ZESTRIL ) 20 MG tablet Take 1 tablet  (20 mg total) by mouth 2 (two) times daily for BP.   [DISCONTINUED] lisinopril  (ZESTRIL ) 20 MG tablet Take 20 mg by mouth in the morning and at bedtime.   [DISCONTINUED] metFORMIN  (GLUCOPHAGE ) 500 MG tablet Take 500 mg by mouth 2 (two) times daily.    [DISCONTINUED] metFORMIN  (GLUCOPHAGE ) 500 MG tablet Take 1 tablet (500 mg total) by mouth 2 (two) times daily for diabetes.   [DISCONTINUED] oxyCODONE -acetaminophen  (PERCOCET/ROXICET) 5-325 MG tablet Take 1 tablet by mouth every 12 (twelve) hours as needed for severe pain.   [DISCONTINUED] polyethylene glycol powder (GLYCOLAX /MIRALAX ) 17 GM/SCOOP powder Take 17 g by mouth daily.   [DISCONTINUED] sildenafil  (VIAGRA ) 100 MG tablet Take 1 tablet as directed by mouth.   [DISCONTINUED] simvastatin  (ZOCOR ) 20 MG tablet Take 1 tablet (20 mg total) by mouth daily for cholesterol.   [DISCONTINUED] simvastatin  (ZOCOR ) 20 MG tablet Take 1 tablet (20 mg total) by mouth daily in the evening for cholesterol   [DISCONTINUED] trimethoprim -polymyxin b  (POLYTRIM ) ophthalmic solution Place 2 drops into both eyes every 4 (four) hours.     Allergies:   Patient has no known allergies.   ROS:   Please see the history of present illness.    All other systems reviewed and are negative.  EKGs/Labs/Other Studies Reviewed:    The following studies were reviewed today: Cardiac Studies & Procedures   ______________________________________________________________________________________________   STRESS TESTS  MYOCARDIAL PERFUSION IMAGING 09/23/2018  Narrative  Nuclear stress EF: 64%.  T wave inversion was noted during stress in the III leads, and returning to baseline after 1-5 mins of recovery.  There was no ST segment deviation noted during stress.  The study is normal.  This is a low risk study.  The left ventricular ejection fraction is normal (55-65%).        CT SCANS  CT CARDIAC SCORING (SELF PAY ONLY) 03/18/2023  Addendum 04/05/2023 10:27  AM ADDENDUM REPORT: 04/05/2023 10:25  EXAM: OVER-READ INTERPRETATION  CT CHEST  The following report is an over-read performed by radiologist Dr. Chadwick Colonel of [facility] on 04/05/2023. This over-read does not include interpretation of cardiac or coronary anatomy or pathology. The coronary calcium  score interpretation by the cardiologist is attached.  COMPARISON:  Chest CT 12/10/2016  FINDINGS: Vascular: No aortic atherosclerosis. The included aorta is normal in caliber.  Mediastinum/nodes: No adenopathy or mass. Mild wall thickening of the distal esophagus.  Lungs: No focal airspace disease. Tiny pulmonary nodules in the right lower lobe series 4, image 23 and right middle lobe series 4, image 27 are unchanged from 2018 exam. No pleural fluid. The included airways are patent.  Upper abdomen: No acute or unexpected findings.  Musculoskeletal: There are no acute or suspicious osseous abnormalities.  IMPRESSION: 1. Mild wall thickening of the distal esophagus, can be seen with reflux. 2. Tiny pulmonary nodules in the right lower  lobe and right middle lobe are unchanged from 2018 exam, considered definitively benign. Per Fleischner Society guidelines, no further imaging follow-up is recommended.   Electronically Signed By: Chadwick Colonel M.D. On: 04/05/2023 10:25  Narrative CLINICAL DATA:  Cardiovascular Disease Risk stratification  EXAM: Coronary Calcium  Score  TECHNIQUE: A gated, non-contrast computed tomography scan of the heart was performed using 3mm slice thickness. Axial images were analyzed on a dedicated workstation. Calcium  scoring of the coronary arteries was performed using the Agatston method.  FINDINGS: Coronary arteries: Normal origins.  Coronary Calcium  Score:  Left main: 0  Left anterior descending artery: 20  Left circumflex artery: 8  Right coronary artery: 138  Total: 166  Percentile: 80th  Pericardium:  Normal.  Ascending Aorta: Normal caliber.  Dilated main pulmonary artery measuring 30mm  Non-cardiac: See separate report from [facility].  IMPRESSION: Coronary calcium  score of 166. This was 80th percentile for age-, race-, and sex-matched controls.  Dilated main pulmonary artery measuring 30mm  RECOMMENDATIONS: Coronary artery calcium  (CAC) score is a strong predictor of incident coronary heart disease (CHD) and provides predictive information beyond traditional risk factors. CAC scoring is reasonable to use in the decision to withhold, postpone, or initiate statin therapy in intermediate-risk or selected borderline-risk asymptomatic adults (age 70-75 years and LDL-C >=70 to <190 mg/dL) who do not have diabetes or established atherosclerotic cardiovascular disease (ASCVD).* In intermediate-risk (10-year ASCVD risk >=7.5% to <20%) adults or selected borderline-risk (10-year ASCVD risk >=5% to <7.5%) adults in whom a CAC score is measured for the purpose of making a treatment decision the following recommendations have been made:  If CAC=0, it is reasonable to withhold statin therapy and reassess in 5 to 10 years, as long as higher risk conditions are absent (diabetes mellitus, family history of premature CHD in first degree relatives (males <55 years; females <65 years), cigarette smoking, or LDL >=190 mg/dL).  If CAC is 1 to 99, it is reasonable to initiate statin therapy for patients >=69 years of age.  If CAC is >=100 or >=75th percentile, it is reasonable to initiate statin therapy at any age.  Cardiology referral should be considered for patients with CAC scores >=400 or >=75th percentile.  *2018 AHA/ACC/AACVPR/AAPA/ABC/ACPM/ADA/AGS/APhA/ASPC/NLA/PCNA Guideline on the Management of Blood Cholesterol: A Report of the American College of Cardiology/American Heart Association Task Force on Clinical Practice Guidelines. J Am Coll  Cardiol. 2019;73(24):3168-3209.  Carson Clara, MD  Electronically Signed: By: Carson Clara M.D. On: 03/18/2023 23:03     ______________________________________________________________________________________________      EKG:   EKG Interpretation Date/Time:  Monday May 19 2023 13:34:50 EDT Ventricular Rate:  69 PR Interval:  124 QRS Duration:  94 QT Interval:  374 QTC Calculation: 400 R Axis:   26  Text Interpretation: Normal sinus rhythm Normal ECG When compared with ECG of 01-Jun-2013 09:28, No significant change was found Confirmed by Arnoldo Lapping [phone]) on 05/19/2023 1:51:37 PM    Recent Labs: 10/04/2022: ALT 31; BUN 16; Creatinine, Ser 0.71; Hemoglobin 16.6; Platelets 279; Potassium 3.8; Sodium 131  Recent Lipid Panel No results found for: "CHOL", "TRIG", "HDL", "CHOLHDL", "VLDL", "LDLCALC", "LDLDIRECT"   Risk Assessment/Calculations:                Physical Exam:    VS:  BP 114/78   Pulse 69   Ht 5\' 8"  (1.727 m)   Wt 219 lb 9.6 oz (99.6 kg)   SpO2 96%   BMI 33.39 kg/m     Wt  Readings from Last 3 Encounters:  05/19/23 219 lb 9.6 oz (99.6 kg)  10/04/22 220 lb (99.8 kg)  09/28/18 224 lb (101.6 kg)     GEN:  Well nourished, well developed in no acute distress HEENT: Normal NECK: No JVD; No carotid bruits LYMPHATICS: No lymphadenopathy CARDIAC: RRR, no murmurs, rubs, gallops RESPIRATORY:  Clear to auscultation without rales, wheezing or rhonchi  ABDOMEN: Soft, non-tender, non-distended MUSCULOSKELETAL:  No edema; No deformity  SKIN: Warm and dry NEUROLOGIC:  Alert and oriented x 3 PSYCHIATRIC:  Normal affect   Assessment & Plan Elevated coronary artery calcium  score Discussed the significance of this finding with the patient.  He understands that he is in the 80th percentile of coronary calcium  score for an age and gender matched cohort.  He understands that a coronary calcium  score between 100-300 indicates an increased risk  of cardiovascular events.  He is completely asymptomatic at a good workload and there is no indication for stress testing.  I think aspirin  is indicated for antiplatelet therapy.  I think he should be on high intensity statin drug and will discontinue simvastatin , will start him on rosuvastatin  20 mg daily.  I like to repeat lipids and LFTs in about 8 to 12 weeks.  I will see him back for follow-up in 2 years.  He follows closely with his primary care physician. Mixed hyperlipidemia Goal LDL cholesterol less than 70 mg/dL, ideally less than 55 if attainable.  Discussed diet and lifestyle modification.  He will aim for continued weight loss.  He will Essential hypertension Blood pressures under optimal control on lisinopril .  Continue the same. Type 2 diabetes mellitus without complication, without long-term current use of insulin (HCC) Discussed the importance of glycemic control with him today.  His last hemoglobin A1c was 9.4.  He was started on metformin  and he has made dietary changes.  Overall, the patient appears to be on the right track.  He was noncompliant with his medications until he saw his primary physician in November and he has made efforts at taking his medications regularly and has worked on dietary changes.  We reviewed the importance of prevention with addition of an aspirin  and intensification of his lipid-lowering regimen.            Medication Adjustments/Labs and Tests Ordered: Current medicines are reviewed at length with the patient today.  Concerns regarding medicines are outlined above.  Orders Placed This Encounter  Procedures   Lipid panel   Hepatic function panel   EKG 12-Lead   Meds ordered this encounter  Medications   rosuvastatin  (CRESTOR ) 20 MG tablet    Sig: Take 1 tablet (20 mg total) by mouth daily.    Dispense:  90 tablet    Refill:  3    Replacing zocor    aspirin  EC 81 MG tablet    Sig: Take 1 tablet (81 mg total) by mouth daily. Swallow whole.     Patient Instructions  Medication Instructions:  STOP Simvastatin  START Rosuvastatin /Crestor  20mg  daily START Aspirin  81mg  daily *If you need a refill on your cardiac medications before your next appointment, please call your pharmacy*  Lab Work: Lipids, Liver in 8-12 weeks If you have labs (blood work) drawn today and your tests are completely normal, you will receive your results only by: MyChart Message (if you have MyChart) OR A paper copy in the mail If you have any lab test that is abnormal or we need to change your treatment, we will call you  to review the results.  Follow-Up: At [facility], you and your health needs are our priority.  As part of our continuing mission to provide you with exceptional heart care, our providers are all part of one team.  This team includes your primary Cardiologist (physician) and Advanced Practice Providers or APPs (Physician Assistants and Nurse Practitioners) who all work together to provide you with the care you need, when you need it.  Your next appointment:   2 year(s)  Provider:   Arnoldo Lapping, MD     1st Floor: - Lobby - Registration  - Pharmacy  - Lab - Cafe  2nd Floor: - PV Lab - Diagnostic Testing (echo, CT, nuclear med)  3rd Floor: - Vacant  4th Floor: - TCTS (cardiothoracic surgery) - AFib Clinic - Structural Heart Clinic - Vascular Surgery  - Vascular Ultrasound  5th Floor: - HeartCare Cardiology (general and EP) - Clinical Pharmacy for coumadin, hypertension, lipid, weight-loss medications, and med management appointments    Valet parking services will be available as well.     Signed, Arnoldo Lapping, MD  05/19/2023 4:49 PM    [facility]

## 2023-05-19 NOTE — Patient Instructions (Signed)
 Medication Instructions:  STOP Simvastatin  START Rosuvastatin /Crestor  20mg  daily START Aspirin  81mg  daily *If you need a refill on your cardiac medications before your next appointment, please call your pharmacy*  Lab Work: Lipids, Liver in 8-12 weeks If you have labs (blood work) drawn today and your tests are completely normal, you will receive your results only by: MyChart Message (if you have MyChart) OR A paper copy in the mail If you have any lab test that is abnormal or we need to change your treatment, we will call you to review the results.  Follow-Up: At [facility], you and your health needs are our priority.  As part of our continuing mission to provide you with exceptional heart care, our providers are all part of one team.  This team includes your primary Cardiologist (physician) and Advanced Practice Providers or APPs (Physician Assistants and Nurse Practitioners) who all work together to provide you with the care you need, when you need it.  Your next appointment:   2 year(s)  Provider:   Arnoldo Lapping, MD     1st Floor: - Lobby - Registration  - Pharmacy  - Lab - Cafe  2nd Floor: - PV Lab - Diagnostic Testing (echo, CT, nuclear med)  3rd Floor: - Vacant  4th Floor: - TCTS (cardiothoracic surgery) - AFib Clinic - Structural Heart Clinic - Vascular Surgery  - Vascular Ultrasound  5th Floor: - HeartCare Cardiology (general and EP) - Clinical Pharmacy for coumadin, hypertension, lipid, weight-loss medications, and med management appointments    Valet parking services will be available as well.

## 2023-06-02 ENCOUNTER — Other Ambulatory Visit (HOSPITAL_COMMUNITY): Payer: Self-pay

## 2023-06-02 MED ORDER — GOLYTELY 236 G PO SOLR
4000.0000 mL | ORAL | 0 refills | Status: AC
  Filled 2023-06-02: qty 4000, 1d supply, fill #0

## 2023-06-11 ENCOUNTER — Other Ambulatory Visit (HOSPITAL_COMMUNITY): Payer: Self-pay

## 2023-06-11 MED ORDER — DIAZEPAM 5 MG PO TABS
5.0000 mg | ORAL_TABLET | Freq: Every day | ORAL | 1 refills | Status: DC
  Filled 2023-06-11: qty 60, 30d supply, fill #0
  Filled 2023-08-25: qty 60, 30d supply, fill #1

## 2023-06-13 ENCOUNTER — Other Ambulatory Visit (HOSPITAL_COMMUNITY): Payer: Self-pay

## 2023-06-14 ENCOUNTER — Other Ambulatory Visit (HOSPITAL_COMMUNITY): Payer: Self-pay

## 2023-06-16 DIAGNOSIS — Z980 Intestinal bypass and anastomosis status: Secondary | ICD-10-CM | POA: Diagnosis not present

## 2023-06-16 DIAGNOSIS — K635 Polyp of colon: Secondary | ICD-10-CM | POA: Diagnosis not present

## 2023-06-16 DIAGNOSIS — K648 Other hemorrhoids: Secondary | ICD-10-CM | POA: Diagnosis not present

## 2023-06-16 DIAGNOSIS — D120 Benign neoplasm of cecum: Secondary | ICD-10-CM | POA: Diagnosis not present

## 2023-06-16 DIAGNOSIS — D124 Benign neoplasm of descending colon: Secondary | ICD-10-CM | POA: Diagnosis not present

## 2023-06-16 DIAGNOSIS — D122 Benign neoplasm of ascending colon: Secondary | ICD-10-CM | POA: Diagnosis not present

## 2023-06-16 DIAGNOSIS — Z09 Encounter for follow-up examination after completed treatment for conditions other than malignant neoplasm: Secondary | ICD-10-CM | POA: Diagnosis not present

## 2023-06-16 DIAGNOSIS — Z1211 Encounter for screening for malignant neoplasm of colon: Secondary | ICD-10-CM | POA: Diagnosis not present

## 2023-08-26 ENCOUNTER — Other Ambulatory Visit (HOSPITAL_COMMUNITY): Payer: Self-pay

## 2023-08-26 ENCOUNTER — Other Ambulatory Visit: Payer: Self-pay

## 2023-08-28 ENCOUNTER — Other Ambulatory Visit (HOSPITAL_COMMUNITY): Payer: Self-pay

## 2023-12-08 ENCOUNTER — Other Ambulatory Visit (HOSPITAL_COMMUNITY): Payer: Self-pay

## 2023-12-09 ENCOUNTER — Other Ambulatory Visit (HOSPITAL_COMMUNITY): Payer: Self-pay

## 2023-12-09 MED ORDER — DIAZEPAM 5 MG PO TABS
5.0000 mg | ORAL_TABLET | Freq: Every day | ORAL | 1 refills | Status: AC
  Filled 2023-12-09: qty 60, 30d supply, fill #0

## 2023-12-10 ENCOUNTER — Other Ambulatory Visit (HOSPITAL_COMMUNITY): Payer: Self-pay

## 2023-12-22 DIAGNOSIS — E559 Vitamin D deficiency, unspecified: Secondary | ICD-10-CM | POA: Diagnosis not present

## 2023-12-22 DIAGNOSIS — I10 Essential (primary) hypertension: Secondary | ICD-10-CM | POA: Diagnosis not present

## 2023-12-22 DIAGNOSIS — E785 Hyperlipidemia, unspecified: Secondary | ICD-10-CM | POA: Diagnosis not present

## 2023-12-22 DIAGNOSIS — F419 Anxiety disorder, unspecified: Secondary | ICD-10-CM | POA: Diagnosis not present

## 2023-12-22 LAB — LAB REPORT - SCANNED
A1c: 6.5
TSH: 1.2 (ref 0.41–5.90)

## 2023-12-29 ENCOUNTER — Other Ambulatory Visit (HOSPITAL_COMMUNITY): Payer: Self-pay

## 2023-12-29 DIAGNOSIS — E119 Type 2 diabetes mellitus without complications: Secondary | ICD-10-CM | POA: Diagnosis not present

## 2023-12-29 DIAGNOSIS — K219 Gastro-esophageal reflux disease without esophagitis: Secondary | ICD-10-CM | POA: Diagnosis not present

## 2023-12-29 DIAGNOSIS — Z0000 Encounter for general adult medical examination without abnormal findings: Secondary | ICD-10-CM | POA: Diagnosis not present

## 2023-12-29 DIAGNOSIS — E785 Hyperlipidemia, unspecified: Secondary | ICD-10-CM | POA: Diagnosis not present

## 2023-12-29 MED ORDER — DIAZEPAM 5 MG PO TABS
5.0000 mg | ORAL_TABLET | Freq: Two times a day (BID) | ORAL | 1 refills | Status: AC
  Filled 2023-12-29: qty 180, 90d supply, fill #0
